# Patient Record
Sex: Female | Born: 1935 | Race: White | Hispanic: No | Smoking: Never smoker
Health system: Southern US, Community
[De-identification: ages and names within clinical notes are randomized; demographics above are authoritative.]

## PROBLEM LIST (undated history)

## (undated) DIAGNOSIS — J189 Pneumonia, unspecified organism: Secondary | ICD-10-CM

## (undated) DIAGNOSIS — W19XXXA Unspecified fall, initial encounter: Secondary | ICD-10-CM

## (undated) DIAGNOSIS — F32A Depression, unspecified: Secondary | ICD-10-CM

## (undated) DIAGNOSIS — F329 Major depressive disorder, single episode, unspecified: Secondary | ICD-10-CM

## (undated) DIAGNOSIS — E785 Hyperlipidemia, unspecified: Secondary | ICD-10-CM

## (undated) DIAGNOSIS — R296 Repeated falls: Secondary | ICD-10-CM

## (undated) DIAGNOSIS — N390 Urinary tract infection, site not specified: Secondary | ICD-10-CM

## (undated) DIAGNOSIS — M1990 Unspecified osteoarthritis, unspecified site: Secondary | ICD-10-CM

## (undated) DIAGNOSIS — E079 Disorder of thyroid, unspecified: Secondary | ICD-10-CM

## (undated) HISTORY — DX: Depression, unspecified: F32.A

## (undated) HISTORY — DX: Major depressive disorder, single episode, unspecified: F32.9

## (undated) HISTORY — DX: Urinary tract infection, site not specified: N39.0

## (undated) HISTORY — PX: APPENDECTOMY: SHX54

---

## 1997-09-05 ENCOUNTER — Other Ambulatory Visit: Admission: RE | Admit: 1997-09-05 | Discharge: 1997-09-05 | Payer: Self-pay | Admitting: Internal Medicine

## 1997-12-21 ENCOUNTER — Ambulatory Visit (HOSPITAL_COMMUNITY): Admission: RE | Admit: 1997-12-21 | Discharge: 1997-12-21 | Payer: Self-pay | Admitting: Internal Medicine

## 1999-07-09 ENCOUNTER — Other Ambulatory Visit: Admission: RE | Admit: 1999-07-09 | Discharge: 1999-07-09 | Payer: Self-pay | Admitting: Obstetrics and Gynecology

## 1999-11-28 ENCOUNTER — Ambulatory Visit (HOSPITAL_COMMUNITY): Admission: RE | Admit: 1999-11-28 | Discharge: 1999-11-28 | Payer: Self-pay | Admitting: Gastroenterology

## 2004-04-16 ENCOUNTER — Emergency Department (HOSPITAL_COMMUNITY): Admission: EM | Admit: 2004-04-16 | Discharge: 2004-04-16 | Payer: Self-pay | Admitting: *Deleted

## 2005-07-17 ENCOUNTER — Ambulatory Visit (HOSPITAL_COMMUNITY): Admission: RE | Admit: 2005-07-17 | Discharge: 2005-07-17 | Payer: Self-pay | Admitting: Gastroenterology

## 2005-12-17 ENCOUNTER — Emergency Department (HOSPITAL_COMMUNITY): Admission: EM | Admit: 2005-12-17 | Discharge: 2005-12-17 | Payer: Self-pay | Admitting: Family Medicine

## 2010-03-18 ENCOUNTER — Encounter: Payer: Self-pay | Admitting: Family Medicine

## 2012-04-09 ENCOUNTER — Observation Stay (HOSPITAL_COMMUNITY): Payer: Medicare Other

## 2012-04-09 ENCOUNTER — Observation Stay (HOSPITAL_COMMUNITY)
Admission: EM | Admit: 2012-04-09 | Discharge: 2012-04-13 | DRG: 554 | Disposition: A | Discharge status: Skilled Nursing Facility | Payer: Medicare Other | Attending: Internal Medicine | Admitting: Internal Medicine

## 2012-04-09 ENCOUNTER — Encounter (HOSPITAL_COMMUNITY): Payer: Self-pay | Admitting: Emergency Medicine

## 2012-04-09 ENCOUNTER — Emergency Department (HOSPITAL_COMMUNITY): Payer: Medicare Other

## 2012-04-09 DIAGNOSIS — M25539 Pain in unspecified wrist: Secondary | ICD-10-CM | POA: Insufficient documentation

## 2012-04-09 DIAGNOSIS — L02419 Cutaneous abscess of limb, unspecified: Secondary | ICD-10-CM | POA: Insufficient documentation

## 2012-04-09 DIAGNOSIS — N390 Urinary tract infection, site not specified: Secondary | ICD-10-CM | POA: Insufficient documentation

## 2012-04-09 DIAGNOSIS — R262 Difficulty in walking, not elsewhere classified: Secondary | ICD-10-CM

## 2012-04-09 DIAGNOSIS — T148XXA Other injury of unspecified body region, initial encounter: Secondary | ICD-10-CM | POA: Diagnosis present

## 2012-04-09 DIAGNOSIS — W19XXXA Unspecified fall, initial encounter: Secondary | ICD-10-CM | POA: Insufficient documentation

## 2012-04-09 DIAGNOSIS — IMO0002 Reserved for concepts with insufficient information to code with codable children: Secondary | ICD-10-CM | POA: Insufficient documentation

## 2012-04-09 DIAGNOSIS — L03115 Cellulitis of right lower limb: Secondary | ICD-10-CM

## 2012-04-09 DIAGNOSIS — Z9181 History of falling: Secondary | ICD-10-CM

## 2012-04-09 DIAGNOSIS — R296 Repeated falls: Secondary | ICD-10-CM

## 2012-04-09 DIAGNOSIS — M25569 Pain in unspecified knee: Secondary | ICD-10-CM | POA: Insufficient documentation

## 2012-04-09 DIAGNOSIS — L03119 Cellulitis of unspecified part of limb: Secondary | ICD-10-CM | POA: Insufficient documentation

## 2012-04-09 DIAGNOSIS — M1710 Unilateral primary osteoarthritis, unspecified knee: Principal | ICD-10-CM | POA: Insufficient documentation

## 2012-04-09 HISTORY — DX: Unspecified osteoarthritis, unspecified site: M19.90

## 2012-04-09 HISTORY — DX: Pneumonia, unspecified organism: J18.9

## 2012-04-09 LAB — CBC
Hemoglobin: 14.8 g/dL (ref 12.0–15.0)
RBC: 4.79 MIL/uL (ref 3.87–5.11)

## 2012-04-09 LAB — BASIC METABOLIC PANEL
CO2: 26 mEq/L (ref 19–32)
Glucose, Bld: 94 mg/dL (ref 70–99)
Potassium: 3.9 mEq/L (ref 3.5–5.1)
Sodium: 140 mEq/L (ref 135–145)

## 2012-04-09 MED ORDER — KETOROLAC TROMETHAMINE 15 MG/ML IJ SOLN
15.0000 mg | Freq: Once | INTRAMUSCULAR | Status: AC
  Administered 2012-04-09: 15 mg via INTRAVENOUS
  Filled 2012-04-09: qty 1

## 2012-04-09 MED ORDER — CLINDAMYCIN PHOSPHATE 600 MG/50ML IV SOLN
600.0000 mg | Freq: Once | INTRAVENOUS | Status: AC
  Administered 2012-04-09: 600 mg via INTRAVENOUS
  Filled 2012-04-09: qty 50

## 2012-04-09 MED ORDER — ACETAMINOPHEN 325 MG PO TABS
650.0000 mg | ORAL_TABLET | Freq: Once | ORAL | Status: AC
  Administered 2012-04-09: 650 mg via ORAL
  Filled 2012-04-09: qty 2

## 2012-04-09 MED ORDER — CEPHALEXIN 250 MG PO CAPS: 250.0000 mg | ORAL_CAPSULE | Freq: Four times a day (QID) | ORAL | Status: DC

## 2012-04-09 NOTE — H&P (Signed)
Chief Complaint:  freq falls  HPI: 79 yo very healthy female who lives alone who over the last several weeks has had numerous falls because "her knees are bad".  She normally walks with a walker and says she tries to get up too fast and her knees are not able to hold her.  She denies any head injuries but she has several abrasiions to her arms and legs.  No fevers.  No dysuria.  No cp, no sob.  No n/v/d.  No illnesses.  She says "if i could just get my knees fixed and my teeth fixed i could live by myself forever."  She has been having issue with her knees for several years and has not had this evaluated.  No slurred speech.  No weakness in her upper ext but some in her legs.  She has been thinking about moving into an assisted living place.  Review of Systems:  Positive and negative as per HPI otherwise all other systems are negative  Past Medical History: negative   Medications: Prior to Admission medications   Medication Sig Start Date End Date Taking? Authorizing Provider  OVER THE COUNTER MEDICATION Take 1 tablet by mouth daily.   Yes Historical Provider, MD  cephALEXin (KEFLEX) 250 MG capsule Take 1 capsule (250 mg total) by mouth 4 (four) times daily. 04/09/12   Toy Cookey, MD    Allergies:  No Known Allergies  Social History:  reports that she has never smoked. She does not have any smokeless tobacco history on file. She reports that she does not drink alcohol or use illicit drugs.  Family History: Negative   Physical Exam: Filed Vitals:   04/09/12 1729 04/09/12 2320  BP: 129/82 141/70  Pulse: 83 79  Temp: 98.4 F (36.9 C)   TempSrc: Oral   Resp:  18  SpO2: 99% 100%   General appearance: alert, cooperative and no distress Neck: no JVD and supple, symmetrical, trachea midline Lungs: clear to auscultation bilaterally Heart: regular rate and rhythm, S1, S2 normal, no murmur, click, rub or gallop Abdomen: soft, non-tender; bowel sounds normal; no masses,  no  organomegaly Pelvic: pelvis stable and nonpainful with movement Extremities: extremities normal, atraumatic, no cyanosis or edema Pulses: 2+ and symmetric Skin: several abrasions to knees ankles none looked infected Neurologic: Grossly normal    Labs on Admission:   Recent Labs  04/09/12 2048  NA 140  K 3.9  CL 104  CO2 26  GLUCOSE 94  BUN 17  CREATININE 0.50  CALCIUM 9.2    Recent Labs  04/09/12 2048  WBC 7.0  HGB 14.8  HCT 42.9  MCV 89.6  PLT 263    Radiological Exams on Admission: Dg Elbow 2 Views Right  04/09/2012  *RADIOLOGY REPORT*  Clinical Data: Fall.  Elbow pain.  RIGHT ELBOW - 2 VIEW  Comparison: None.  Findings: Calcification noted along the elbow joint, potentially from chondrocalcinosis.  Indistinct anterior fat pad favors elbow joint effusion.  Prominent spurring along articular surfaces.  A discrete fracture is not identified on this two views series.  IMPRESSION: 1.  Elbow joint effusion, with chondrocalcinosis.  Cannot exclude loose bodies in the joint.  2.  Prominent spurring.  3.  No fracture observed.   Original Report Authenticated By: Gaylyn Rong, M.D.    Dg Knee 2 Views Left  04/09/2012  *RADIOLOGY REPORT*  Clinical Data: Fall.  Knee pain.  LEFT KNEE - 1-2 VIEW  Comparison: None.  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  Prepatellar infiltrative edema noted.  Exam indeterminate for knee effusion due to the degree of flexion and spurring.  A 1.4 cm subcortical cystic lesion is present in the lateral tibial plateau, with a similar lesion along the tibial spine.  No overt fracture observed.  IMPRESSION:  1.  Severe tri-compartmental spurring loss of articular space, with scattered calcifications along the joint suspicious for synovial osteochondromatosis. 2.  No definite fracture. 3.  Prepatellar subcutaneous edema. 4.  Subcortical cystic lesions along the tibial  plateau. 5.  Degree of cortical irregularity reduces sensitivity for nondisplaced fractures.   Original Report Authenticated By: Gaylyn Rong, M.D.    Dg Knee 2 Views Right  04/09/2012  *RADIOLOGY REPORT*  Clinical Data: Fall.  Knee pain.  RIGHT KNEE - 1-2 VIEW  Comparison: None.  Findings: As on the contralateral side, there is severe spurring and loss articular space with calcifications along the joint. Possibilities include CPPD arthropathy or synovial osteochondromatosis.  Mild prepatellar subcutaneous edema noted. No discrete fracture.  IMPRESSION:  1.  No discrete fracture. 2.  Scattered synovial calcifications and possible chondrocalcinosis, query CPPD arthropathy and / or synovial osteochondromatosis. 3.  Severe spurring and loss articular space.   Original Report Authenticated By: Gaylyn Rong, M.D.     Assessment/Plan 79 yo female with freq falls  Principal Problem:   Unable to ambulate Active Problems:   Frequent falls   Abrasion  Will obs the pt overnight.  Obtain physical therapy evaluation in am.  Ck cth to make sure no intracranial abnormality to explain frequent falls, no focal deficits but pt cannot ambulate.  She can bear her own weight but her knees are too weak too walk with her cane.  Abrasions do not appear to have surrounding cellulitis will hold off on further abx at this time, needs to be monitored while here though.  Med bed.  Geraline Halberstadt A 04/09/2012, 11:49 PM

## 2012-04-09 NOTE — ED Provider Notes (Signed)
History     CSN: [id number]  Arrival date & time 04/09/12  1722   First MD Initiated Contact with Patient 04/09/12 1743      Chief Complaint  Patient presents with  . Fall  . Knee Pain  . Elbow Pain    (Consider location/radiation/quality/duration/timing/severity/associated sxs/prior treatment) Patient is a 79 y.o. female presenting with fall and knee pain. The history is provided by the patient. No language interpreter was used.  Fall The accident occurred less than 1 hour ago. The fall occurred while walking. She fell from a height of 3 to 5 ft. She landed on a hard floor. There was no blood loss. The point of impact was the right elbow, right knee and left knee. The pain is present in the right elbow, left knee and right knee. The pain is moderate. She was not ambulatory at the scene. There was no entrapment after the fall. There was no drug use involved in the accident. There was no alcohol use involved in the accident. Pertinent negatives include no fever, no numbness, no abdominal pain, no nausea, no vomiting, no headaches, no loss of consciousness and no tingling. The symptoms are aggravated by activity and pressure on the injury. She has tried nothing for the symptoms. The treatment provided no relief.  Knee Pain Associated symptoms: no back pain, no fatigue, no fever and no neck pain     History reviewed. No pertinent past medical history.  History reviewed. No pertinent past surgical history.  No family history on file.  History  Substance Use Topics  . Smoking status: Never Smoker   . Smokeless tobacco: Not on file  . Alcohol Use: No    OB History   Grav Para Term Preterm Abortions TAB SAB Ect Mult Living                  Review of Systems  Constitutional: Negative for fever, chills, diaphoresis, activity change, appetite change and fatigue.  HENT: Negative for congestion, sore throat, facial swelling, rhinorrhea, neck pain and neck stiffness.   Eyes: Negative  for photophobia and discharge.  Respiratory: Negative for cough, chest tightness and shortness of breath.   Cardiovascular: Negative for chest pain, palpitations and leg swelling.  Gastrointestinal: Negative for nausea, vomiting, abdominal pain and diarrhea.  Endocrine: Negative for polydipsia and polyuria.  Genitourinary: Negative for dysuria, frequency, difficulty urinating and pelvic pain.  Musculoskeletal: Positive for arthralgias. Negative for back pain.  Skin: Negative for color change and wound.  Allergic/Immunologic: Negative for immunocompromised state.  Neurological: Negative for tingling, loss of consciousness, facial asymmetry, weakness, numbness and headaches.  Hematological: Does not bruise/bleed easily.  Psychiatric/Behavioral: Negative for confusion and agitation.    Allergies  Review of patient's allergies indicates no known allergies.  Home Medications   Current Outpatient Rx  Name  Route  Sig  Dispense  Refill  . OVER THE COUNTER MEDICATION   Oral   Take 1 tablet by mouth daily.         . cephALEXin (KEFLEX) 250 MG capsule   Oral   Take 1 capsule (250 mg total) by mouth 4 (four) times daily.   28 capsule   0     BP 141/70  Pulse 79  Temp(Src) 98.4 F (36.9 C) (Oral)  Resp 18  SpO2 100%  Physical Exam  Constitutional: She is oriented to person, place, and time. She appears well-developed and well-nourished. No distress.  HENT:  Head: Normocephalic and atraumatic.  Mouth/Throat: No  oropharyngeal exudate.  Eyes: Pupils are equal, round, and reactive to light.  Neck: Normal range of motion. Neck supple.  Cardiovascular: Normal rate, regular rhythm and normal heart sounds.  Exam reveals no gallop and no friction rub.   No murmur heard. Pulmonary/Chest: Effort normal and breath sounds normal. No respiratory distress. She has no wheezes. She has no rales.  Abdominal: Soft. Bowel sounds are normal. She exhibits no distension and no mass. There is no  tenderness. There is no rebound and no guarding.  Musculoskeletal: Normal range of motion. She exhibits no edema and no tenderness.       Right knee: She exhibits erythema. Tenderness found.       Right forearm: She exhibits tenderness and bony tenderness.       Arms:      Legs: Neurological: She is alert and oriented to person, place, and time.  Skin: Skin is warm and dry.  Psychiatric: She has a normal mood and affect.    ED Course  Procedures (including critical care time)  Labs Reviewed  BASIC METABOLIC PANEL - Abnormal; Notable for the following:    GFR calc non Af Amer 82 (*)    All other components within normal limits  CBC   Dg Elbow 2 Views Right  04/09/2012  *RADIOLOGY REPORT*  Clinical Data: Fall.  Elbow pain.  RIGHT ELBOW - 2 VIEW  Comparison: None.  Findings: Calcification noted along the elbow joint, potentially from chondrocalcinosis.  Indistinct anterior fat pad favors elbow joint effusion.  Prominent spurring along articular surfaces.  A discrete fracture is not identified on this two views series.  IMPRESSION: 1.  Elbow joint effusion, with chondrocalcinosis.  Cannot exclude loose bodies in the joint.  2.  Prominent spurring.  3.  No fracture observed.   Original Report Authenticated By: Gaylyn Rong, M.D.    Dg Knee 2 Views Left  04/09/2012  *RADIOLOGY REPORT*  Clinical Data: Fall.  Knee pain.  LEFT KNEE - 1-2 VIEW  Comparison: None.  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  Prepatellar infiltrative edema noted.  Exam indeterminate for knee effusion due to the degree of flexion and spurring.  A 1.4 cm subcortical cystic lesion is present in the lateral tibial plateau, with a similar lesion along the tibial spine.  No overt fracture observed.  IMPRESSION:  1.  Severe tri-compartmental spurring loss of articular space, with scattered calcifications along the joint suspicious for  synovial osteochondromatosis. 2.  No definite fracture. 3.  Prepatellar subcutaneous edema. 4.  Subcortical cystic lesions along the tibial plateau. 5.  Degree of cortical irregularity reduces sensitivity for nondisplaced fractures.   Original Report Authenticated By: Gaylyn Rong, M.D.    Dg Knee 2 Views Right  04/09/2012  *RADIOLOGY REPORT*  Clinical Data: Fall.  Knee pain.  RIGHT KNEE - 1-2 VIEW  Comparison: None.  Findings: As on the contralateral side, there is severe spurring and loss articular space with calcifications along the joint. Possibilities include CPPD arthropathy or synovial osteochondromatosis.  Mild prepatellar subcutaneous edema noted. No discrete fracture.  IMPRESSION:  1.  No discrete fracture. 2.  Scattered synovial calcifications and possible chondrocalcinosis, query CPPD arthropathy and / or synovial osteochondromatosis. 3.  Severe spurring and loss articular space.   Original Report Authenticated By: Gaylyn Rong, M.D.      1. Fall from standing   2. Cellulitis of leg without foot, right   3. Abrasion   4. Frequent  falls   5. Unable to ambulate       MDM  12:35 AM Pt is a 79 y.o. female with no pertinent PMHX who presents with mechanical fall while walking in her house today after she took a "turn too fast".  She complains of R elbow R knee, mild L knee pain.  She denies h/a, neck pain, chest pain, ab pain, pelvic pain.  She also reports a similar fall about 1 week ago, during which she also fell on R elbow & R knee and has abrasions at both.  GCS 15, no focal neuro findings on exam.  +ttp at R olecranon process of elbow, +TTP BL knees.  Abrasion of R knee has mild surrounding erythema w/ concern for development of cellulitis.  Have ordered XR elbow, BL knee.  WIll plan on treating for cellulitis w/ PO keflex. Pt does not want anything for pain.    12:35 AM Attempted to ambulate pt, who was unable.  No family available to pick up or meet ambulance at her  house.  Given her immobolity, cellulitis, I feel admission necessary.   12:35 AM Have spoken to IM who request CT head, if nml will admit to a medical bed.  CT head unremarkable per radiology quick review upon request.  Will admit to IM.Marland Kitchen  1. Fall from standing   2. Cellulitis of leg without foot, right   3. Abrasion   4. Frequent falls   5. Unable to ambulate      Labs and imaging considered in decision making, reviewed by myself.  Imaging interpreted by radiology. Pt care discussed with my attending, Dr. Silverio Lay.         Toy Cookey, MD 04/10/12 [phone]

## 2012-04-09 NOTE — ED Notes (Signed)
EMS-pt was walking in snow and fell, landing on right side. Pt with hx of falls and uses cane for ambulation. Pt reports pain to right elbow and right knee. On exam pt with hematoma and scabbing noted to right elbow, right knee, and right ankle. Pt moves all extremities. Mild redness noted.

## 2012-04-09 NOTE — ED Notes (Signed)
Attempted to ambulate pt.  Pt states has "special walker" at home that she uses and only has her cane here with her.  Pt will not attempt to try to walk with this nurse.

## 2012-04-09 NOTE — ED Provider Notes (Addendum)
I have supervised the resident on the management of this patient and agree with the note above. I personally interviewed and examined the patient and my addendum is below.   Holly Rivera is a 79 y.o. female here s/p fall. Mechanical fall. Hit R elbow and R knee. No head injury. She fell a week ago and had some abrasions now becoming cellulitis. Tetanus up to date Patient ambulating, comfortable. R knee and elbow ROM nl. Will get xrays, will treat for cellulitis with keflex.   7:19 PM Xray showed arthritis, no fractures. Patient unable to ambulate and no one to care for her at home. Will need admission for cellulitis and rehab.    Richardean Canal, MD 04/09/12 1919  Richardean Canal, MD 04/09/12 2049

## 2012-04-10 ENCOUNTER — Encounter (HOSPITAL_COMMUNITY): Payer: Self-pay | Admitting: Radiology

## 2012-04-10 DIAGNOSIS — M1710 Unilateral primary osteoarthritis, unspecified knee: Secondary | ICD-10-CM

## 2012-04-10 LAB — URINALYSIS, ROUTINE W REFLEX MICROSCOPIC
Glucose, UA: NEGATIVE mg/dL
Protein, ur: NEGATIVE mg/dL
Specific Gravity, Urine: 1.028 (ref 1.005–1.030)
pH: 5 (ref 5.0–8.0)

## 2012-04-10 LAB — URINE MICROSCOPIC-ADD ON

## 2012-04-10 MED ORDER — SODIUM CHLORIDE 0.9 % IV SOLN: 250.0000 mL | INTRAVENOUS | Status: DC | PRN

## 2012-04-10 MED ORDER — ENOXAPARIN SODIUM 30 MG/0.3ML ~~LOC~~ SOLN
30.0000 mg | SUBCUTANEOUS | Status: DC
  Administered 2012-04-10 – 2012-04-13 (×4): 30 mg via SUBCUTANEOUS
  Filled 2012-04-10 (×4): qty 0.3

## 2012-04-10 MED ORDER — ACETAMINOPHEN 325 MG PO TABS
650.0000 mg | ORAL_TABLET | Freq: Four times a day (QID) | ORAL | Status: DC | PRN
  Administered 2012-04-10 – 2012-04-12 (×3): 650 mg via ORAL
  Filled 2012-04-10 (×3): qty 2

## 2012-04-10 MED ORDER — TRAMADOL HCL 50 MG PO TABS
50.0000 mg | ORAL_TABLET | Freq: Two times a day (BID) | ORAL | Status: DC | PRN
  Administered 2012-04-12: 50 mg via ORAL
  Filled 2012-04-10: qty 1

## 2012-04-10 MED ORDER — MUSCLE RUB 10-15 % EX CREA
TOPICAL_CREAM | CUTANEOUS | Status: DC | PRN
  Administered 2012-04-10 – 2012-04-12 (×3): via TOPICAL
  Filled 2012-04-10: qty 85

## 2012-04-10 MED ORDER — SODIUM CHLORIDE 0.9 % IJ SOLN: 3.0000 mL | INTRAMUSCULAR | Status: DC | PRN

## 2012-04-10 MED ORDER — SODIUM CHLORIDE 0.9 % IJ SOLN
3.0000 mL | Freq: Two times a day (BID) | INTRAMUSCULAR | Status: DC
Start: 2012-04-10 — End: 2012-04-13
  Administered 2012-04-11: 3 mL via INTRAVENOUS

## 2012-04-10 NOTE — ED Notes (Signed)
Report given to angelo, rn,  Pt transported via stretcher to floor.

## 2012-04-10 NOTE — Evaluation (Signed)
Physical Therapy Evaluation Patient Details Name: Holly Rivera MRN: [id number] DOB: [birth year] Today's Date: 04/10/2012 Time: 4098-1191 PT Time Calculation (min): 18 min  PT Assessment / Plan / Recommendation Clinical Impression  Pt presents secondary to multiple falls and inability to ambulate.  Pt cooperative and willing to participate but at this time still is not able to reach standing position secondary to pain.  Pt unwilling to attempt ambulation secondary to pain in right knee.  Pt required max total assist for a stand pivot to chair.  Strength testing in LE was also limited by pain.  At this time do not feel patient will be safe to return to living alone.  Unsure root cause of patients inability to stand or ambulate. Will continue to see acutely to address deficits, PT recommending SNF as pt states no one can stay with her or take care of her. (no family present, question pts ability as historian)    PT Assessment  Patient needs continued PT services    Follow Up Recommendations  SNF    Does the patient have the potential to tolerate intense rehabilitation      Barriers to Discharge Inaccessible home environment;Decreased caregiver support Pt has stairs to enter home, patient lives alone    Equipment Recommendations  None recommended by PT    Recommendations for Other Services OT consult   Frequency Min 3X/week    Precautions / Restrictions Restrictions Weight Bearing Restrictions: No   Pertinent Vitals/Pain "alot of pain"       Mobility  Bed Mobility Bed Mobility: Rolling Right;Right Sidelying to Sit;Sitting - Scoot to Edge of Bed Rolling Right: 5: Supervision Right Sidelying to Sit: 5: Supervision Sitting - Scoot to Edge of Bed: 5: Supervision Transfers Transfers: Sit to Stand;Stand to [employer] Transfers Sit to Stand: 2: Max assist;From bed;Other (comment) (unable to perform despite 3 attempts) Stand Pivot Transfers: 1: +1 Total assist Details for  Transfer Assistance:  (Pt unable to bear weight and reach standing position) Ambulation/Gait Ambulation/Gait Assistance: Not tested (comment) (pts unable to reach standing, amb. not possible at this time)    Exercises     PT Diagnosis:    PT Problem List: Decreased strength;Decreased range of motion;Decreased activity tolerance;Decreased balance;Decreased mobility;Decreased coordination PT Treatment Interventions: DME instruction;Gait training;Stair training;Functional mobility training;Therapeutic activities;Therapeutic exercise;Balance training;Patient/family education   PT Goals Acute Rehab PT Goals PT Goal Formulation: With patient Time For Goal Achievement: 04/17/12 Potential to Achieve Goals: Fair Pt will go Supine/Side to Sit: Independently PT Goal: Supine/Side to Sit - Progress: Goal set today Pt will go Sit to Supine/Side: with modified independence PT Goal: Sit to Supine/Side - Progress: Goal set today Pt will go Sit to Stand: with supervision PT Goal: Sit to Stand - Progress: Goal set today Pt will go Stand to Sit: with supervision PT Goal: Stand to Sit - Progress: Goal set today Pt will Stand: with modified independence PT Goal: Stand - Progress: Goal set today Pt will Ambulate: 51 - 150 feet PT Goal: Ambulate - Progress: Goal set today Pt will Go Up / Down Stairs: 6-9 stairs;with min assist PT Goal: Up/Down Stairs - Progress: Goal set today  Visit Information  Last PT Received On: 04/10/12 Assistance Needed: +2    Subjective Data  Subjective: I would like to try to get up Patient Stated Goal: to be able to walk   Prior Functioning  Home Living Lives With: Alone Type of Home: Apartment Home Access: Stairs to enter [employer] of  Steps: 6 Home Layout: One level Home Adaptive Equipment: Straight cane;Walker - rolling Prior Function Level of Independence: Independent with assistive device(s) Able to Take Stairs?: Yes (with difficulty) Driving: No     Cognition  Cognition Overall Cognitive Status: No family/caregiver present to determine baseline cognitive functioning Arousal/Alertness: Awake/alert Orientation Level: Oriented X4 / Intact Behavior During Session: [facility] for tasks performed    Extremity/Trunk Assessment Right Upper Extremity Assessment RUE ROM/Strength/Tone: [facility] for tasks assessed Left Upper Extremity Assessment LUE ROM/Strength/Tone: [facility] for tasks assessed Right Lower Extremity Assessment RLE ROM/Strength/Tone: Deficits;Unable to fully assess;Due to pain Left Lower Extremity Assessment LLE ROM/Strength/Tone: [facility] for tasks assessed   Balance Balance Balance Assessed: Yes Static Sitting Balance Static Sitting - Balance Support: Feet supported Static Sitting - Level of Assistance: 5: Stand by assistance Static Sitting - Comment/# of Minutes: 3 minutes  End of Session PT - End of Session Equipment Utilized During Treatment: Gait belt Activity Tolerance: Patient limited by pain Patient left: in chair;with call bell/phone within reach Nurse Communication: Mobility status  GP Functional Assessment Tool Used: clinical judgement Functional Limitation: Mobility: Walking and moving around Mobility: Walking and Moving Around Current Status (E9528): At least 20 percent but less than 40 percent impaired, limited or restricted Mobility: Walking and Moving Around Goal Status [id number]): At least 60 percent but less than 80 percent impaired, limited or restricted   Fabio Asa 04/10/2012, 2:01 PM Charlotte Crumb, PT DPT  [phone]

## 2012-04-10 NOTE — Progress Notes (Addendum)
[facility] Note  Subjective: Interval History: none.  Holly Rivera reports that she has had knee pain for many years and has wanted to get her knees operated on, but has not been able to afford to.  She also has poor dentition, which limits her surgery options.  She walks in her home with a cane and frequently falls due to her knees giving out.  She is interested in moving in to a place with more assistance.   Objective: Vital signs in last 24 hours: Temp:  [98 F (36.7 C)-98.4 F (36.9 C)] 98 F (36.7 C) (02/14 0658) Pulse Rate:  [79-83] 83 (02/14 0658) Resp:  [18-20] 18 (02/14 0658) BP: (114-141)/(66-82) 114/72 mmHg (02/14 0658) SpO2:  [98 %-100 %] 98 % (02/14 0658) Weight:  [138 lb 3.7 oz (62.7 kg)] 138 lb 3.7 oz (62.7 kg) (02/14 0115)  PE:  Gen: Awake, alert, pleasant HENT; NCAT, EOMI Lungs: CTAB, no wheezing.  CVS: RR, NR, no murmur Abd: Soft, NT, ND, +BS Ext: large deformities to the right knee, likely due to longstanding OA, Limbs are thin, no acute effusion, bruising or erythema.  She also has changes to the left knee, though not as severe Pulses: 2+ and symmetric Neuro: Grossly normal, oriented to person, place and time.   Results for orders placed during the hospital encounter of 04/09/12 (from the past 24 hour(s))  CBC     Status: None   Collection Time    04/09/12  8:48 PM      Result Value Range   WBC 7.0  4.0 - 10.5 K/uL   RBC 4.79  3.87 - 5.11 MIL/uL   Hemoglobin 14.8  12.0 - 15.0 g/dL   HCT 16.1  09.6 - 04.5 %   MCV 89.6  78.0 - 100.0 fL   MCH 30.9  26.0 - 34.0 pg   MCHC 34.5  30.0 - 36.0 g/dL   RDW 40.9  81.1 - 91.4 %   Platelets 263  150 - 400 K/uL  BASIC METABOLIC PANEL     Status: Abnormal   Collection Time    04/09/12  8:48 PM      Result Value Range   Sodium 140  135 - 145 mEq/L   Potassium 3.9  3.5 - 5.1 mEq/L   Chloride 104  96 - 112 mEq/L   CO2 26  19 - 32 mEq/L   Glucose, Bld 94  70 - 99 mg/dL   BUN 17  6 - 23 mg/dL   Creatinine,  Ser 7.82  0.50 - 1.10 mg/dL   Calcium 9.2  8.4 - 95.6 mg/dL   GFR calc non Af Amer 82 (*) >90 mL/min   GFR calc Af Amer >90  >90 mL/min    Studies/Results: Dg Elbow 2 Views Right  04/09/2012  *RADIOLOGY REPORT*  Clinical Data: Fall.  Elbow pain.  RIGHT ELBOW - 2 VIEW  Comparison: None.  Findings: Calcification noted along the elbow joint, potentially from chondrocalcinosis.  Indistinct anterior fat pad favors elbow joint effusion.  Prominent spurring along articular surfaces.  A discrete fracture is not identified on this two views series.  IMPRESSION: 1.  Elbow joint effusion, with chondrocalcinosis.  Cannot exclude loose bodies in the joint.  2.  Prominent spurring.  3.  No fracture observed.   Original Report Authenticated By: Gaylyn Rong, M.D.    Dg Knee 2 Views Left  04/09/2012  *RADIOLOGY REPORT*  Clinical Data: Fall.  Knee pain.  LEFT KNEE - 1-2  VIEW  Comparison: None.  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  Prepatellar infiltrative edema noted.  Exam indeterminate for knee effusion due to the degree of flexion and spurring.  A 1.4 cm subcortical cystic lesion is present in the lateral tibial plateau, with a similar lesion along the tibial spine.  No overt fracture observed.  IMPRESSION:  1.  Severe tri-compartmental spurring loss of articular space, with scattered calcifications along the joint suspicious for synovial osteochondromatosis. 2.  No definite fracture. 3.  Prepatellar subcutaneous edema. 4.  Subcortical cystic lesions along the tibial plateau. 5.  Degree of cortical irregularity reduces sensitivity for nondisplaced fractures.   Original Report Authenticated By: Gaylyn Rong, M.D.    Dg Knee 2 Views Right  04/09/2012  *RADIOLOGY REPORT*  Clinical Data: Fall.  Knee pain.  RIGHT KNEE - 1-2 VIEW  Comparison: None.  Findings: As on the contralateral side, there is severe spurring and loss  articular space with calcifications along the joint. Possibilities include CPPD arthropathy or synovial osteochondromatosis.  Mild prepatellar subcutaneous edema noted. No discrete fracture.  IMPRESSION:  1.  No discrete fracture. 2.  Scattered synovial calcifications and possible chondrocalcinosis, query CPPD arthropathy and / or synovial osteochondromatosis. 3.  Severe spurring and loss articular space.   Original Report Authenticated By: Gaylyn Rong, M.D.    Ct Head Wo Contrast  04/10/2012  *RADIOLOGY REPORT*  Clinical Data: Status post fall; landed on right side.  Concern for head injury.  CT HEAD WITHOUT CONTRAST  Technique:  Contiguous axial images were obtained from the base of the skull through the vertex without contrast.  Comparison: None.  Findings: There is no evidence of acute infarction, mass lesion, or intra- or extra-axial hemorrhage on CT.  Prominence of the ventricles and sulci reflects mild cortical volume loss.  Scattered periventricular and subcortical white matter change likely reflects small vessel ischemic microangiopathy.  Mild cerebellar atrophy is noted.  The brainstem and fourth ventricle are within normal limits.  The basal ganglia are unremarkable in appearance.  The cerebral hemispheres demonstrate grossly normal gray-white differentiation. No mass effect or midline shift is seen.  There is no evidence of fracture; there is chronic degeneration at the right temporomandibular joint.  The visualized portions of the orbits are within normal limits.  The paranasal sinuses and mastoid air cells are well-aerated.  No significant soft tissue abnormalities are seen.  IMPRESSION:  1.  No evidence of traumatic intracranial injury or fracture. 2.  Mild cortical volume loss and scattered small vessel ischemic microangiopathy. 3.  Chronic degeneration at the right temporomandibular joint.   Original Report Authenticated By: Tonia Ghent, M.D.     Scheduled Meds: . enoxaparin (LOVENOX)  injection  30 mg Subcutaneous Q24H  . sodium chloride  3 mL Intravenous Q12H   Continuous Infusions:  PRN Meds:sodium chloride, sodium chloride  Assessment/Plan:  1. Unable to ambulate, severe OA to knees - Awaiting PT assessment today  - She will likely need SNF for rehab vs. ALF  - She will need community/PCP assistance to get her teeth evaluated (she may not be able to afford this) and possibly have knee surgery, however, given her age, this may not be possible - Abrasions due to fall are covered - No AM labs needed, today's were normal  Disposition: Pending PT evaluation   LOS: 1 day   Shantika Bermea

## 2012-04-11 DIAGNOSIS — R5381 Other malaise: Secondary | ICD-10-CM

## 2012-04-11 MED ORDER — CIPROFLOXACIN HCL 250 MG PO TABS
250.0000 mg | ORAL_TABLET | Freq: Two times a day (BID) | ORAL | Status: DC
  Administered 2012-04-11 – 2012-04-13 (×5): 250 mg via ORAL
  Filled 2012-04-11 (×8): qty 1

## 2012-04-11 NOTE — Plan of Care (Signed)
Problem: Phase II Progression Outcomes Goal: IV changed to normal saline lock Outcome: Not Applicable Date Met:  04/11/12 Patient removed SL/IV.

## 2012-04-11 NOTE — Progress Notes (Signed)
Was called to the patient's room after receiving a phone call from the secretary that the patient was on the phone with [facility] [employer].  She had told them that she had fallen and got herself back in the bed.  Upon arrival to the room, the patient was upside down in the bed.  She wiggles her body in the bed but she cannot lift her shoulders off the bed under her own power, nor can she get herself up to the bedside commode without significant assistance.  The patient has been confused all day as she has a UTI.  Patient was examined thoroughly and found to have no new injury (the patient is here for frequent falls at home).  Therefore it is felt that the patient had an episode of confusion and did not fall, especially since she cannot get herself up or position herself to any degree.  Will continue to follow.  ---------Roland Rack, RN

## 2012-04-11 NOTE — Progress Notes (Signed)
[facility] Note  Subjective: Interval History: none.  Holly Rivera reports that she has had knee pain for many years and has wanted to get her knees operated on, but has not been able to afford to.  She also has poor dentition, which limits her surgery options.  She walks in her home with a cane and frequently falls due to her knees giving out.  She is interested in moving in to a place with more assistance. Patient is awaiting placement at this time. She asked me to have some bengay to use for her knees  Objective: Vital signs in last 24 hours: Temp:  [98.2 F (36.8 C)-99.7 F (37.6 C)] 99.7 F (37.6 C) (02/15 0610) Pulse Rate:  [86-98] 86 (02/15 0610) Resp:  [18-20] 18 (02/15 0610) BP: (101-134)/(46-66) 123/59 mmHg (02/15 0610) SpO2:  [96 %-98 %] 96 % (02/15 0610)  PE:  Gen: Awake, alert, pleasant HENT; NCAT, EOMI Lungs: CTAB, no wheezing.  CVS: RR, NR, no murmur Abd: Soft, NT, ND, +BS Ext: large deformities to the right knee, likely due to longstanding OA, Limbs are thin, no acute effusion, bruising or erythema.  She also has changes to the left knee, though not as severe Pulses: 2+ and symmetric Neuro: Grossly normal, oriented to person, place and time.   Results for orders placed during the hospital encounter of 04/09/12 (from the past 24 hour(s))  URINALYSIS, ROUTINE W REFLEX MICROSCOPIC     Status: Abnormal   Collection Time    04/10/12  8:35 PM      Result Value Range   Color, Urine YELLOW  YELLOW   APPearance TURBID (*) CLEAR   Specific Gravity, Urine 1.028  1.005 - 1.030   pH 5.0  5.0 - 8.0   Glucose, UA NEGATIVE  NEGATIVE mg/dL   Hgb urine dipstick MODERATE (*) NEGATIVE   Bilirubin Urine NEGATIVE  NEGATIVE   Ketones, ur NEGATIVE  NEGATIVE mg/dL   Protein, ur NEGATIVE  NEGATIVE mg/dL   Urobilinogen, UA 0.2  0.0 - 1.0 mg/dL   Nitrite POSITIVE (*) NEGATIVE   Leukocytes, UA LARGE (*) NEGATIVE  URINE MICROSCOPIC-ADD ON     Status: Abnormal   Collection Time     04/10/12  8:35 PM      Result Value Range   Squamous Epithelial / LPF MANY (*) RARE   WBC, UA TOO NUMEROUS TO COUNT  <3 WBC/hpf   RBC / HPF 0-2  <3 RBC/hpf   Bacteria, UA MANY (*) RARE   Crystals CA OXALATE CRYSTALS (*) NEGATIVE    Studies/Results: Dg Elbow 2 Views Right  04/09/2012  *RADIOLOGY REPORT*  Clinical Data: Fall.  Elbow pain.  RIGHT ELBOW - 2 VIEW  Comparison: None.  Findings: Calcification noted along the elbow joint, potentially from chondrocalcinosis.  Indistinct anterior fat pad favors elbow joint effusion.  Prominent spurring along articular surfaces.  A discrete fracture is not identified on this two views series.  IMPRESSION: 1.  Elbow joint effusion, with chondrocalcinosis.  Cannot exclude loose bodies in the joint.  2.  Prominent spurring.  3.  No fracture observed.   Original Report Authenticated By: Gaylyn Rong, M.D.    Dg Knee 2 Views Left  04/09/2012  *RADIOLOGY REPORT*  Clinical Data: Fall.  Knee pain.  LEFT KNEE - 1-2 VIEW  Comparison: None.  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  Prepatellar infiltrative edema noted.  Exam indeterminate for knee effusion due to the  degree of flexion and spurring.  A 1.4 cm subcortical cystic lesion is present in the lateral tibial plateau, with a similar lesion along the tibial spine.  No overt fracture observed.  IMPRESSION:  1.  Severe tri-compartmental spurring loss of articular space, with scattered calcifications along the joint suspicious for synovial osteochondromatosis. 2.  No definite fracture. 3.  Prepatellar subcutaneous edema. 4.  Subcortical cystic lesions along the tibial plateau. 5.  Degree of cortical irregularity reduces sensitivity for nondisplaced fractures.   Original Report Authenticated By: Gaylyn Rong, M.D.    Dg Knee 2 Views Right  04/09/2012  *RADIOLOGY REPORT*  Clinical Data: Fall.  Knee pain.  RIGHT KNEE - 1-2  VIEW  Comparison: None.  Findings: As on the contralateral side, there is severe spurring and loss articular space with calcifications along the joint. Possibilities include CPPD arthropathy or synovial osteochondromatosis.  Mild prepatellar subcutaneous edema noted. No discrete fracture.  IMPRESSION:  1.  No discrete fracture. 2.  Scattered synovial calcifications and possible chondrocalcinosis, query CPPD arthropathy and / or synovial osteochondromatosis. 3.  Severe spurring and loss articular space.   Original Report Authenticated By: Gaylyn Rong, M.D.    Ct Head Wo Contrast  04/10/2012  *RADIOLOGY REPORT*  Clinical Data: Status post fall; landed on right side.  Concern for head injury.  CT HEAD WITHOUT CONTRAST  Technique:  Contiguous axial images were obtained from the base of the skull through the vertex without contrast.  Comparison: None.  Findings: There is no evidence of acute infarction, mass lesion, or intra- or extra-axial hemorrhage on CT.  Prominence of the ventricles and sulci reflects mild cortical volume loss.  Scattered periventricular and subcortical white matter change likely reflects small vessel ischemic microangiopathy.  Mild cerebellar atrophy is noted.  The brainstem and fourth ventricle are within normal limits.  The basal ganglia are unremarkable in appearance.  The cerebral hemispheres demonstrate grossly normal gray-white differentiation. No mass effect or midline shift is seen.  There is no evidence of fracture; there is chronic degeneration at the right temporomandibular joint.  The visualized portions of the orbits are within normal limits.  The paranasal sinuses and mastoid air cells are well-aerated.  No significant soft tissue abnormalities are seen.  IMPRESSION:  1.  No evidence of traumatic intracranial injury or fracture. 2.  Mild cortical volume loss and scattered small vessel ischemic microangiopathy. 3.  Chronic degeneration at the right temporomandibular joint.    Original Report Authenticated By: Tonia Ghent, M.D.     Scheduled Meds: . ciprofloxacin  250 mg Oral BID  . enoxaparin (LOVENOX) injection  30 mg Subcutaneous Q24H  . sodium chloride  3 mL Intravenous Q12H   Continuous Infusions:  PRN Meds:sodium chloride, acetaminophen, Muscle Rub, sodium chloride, traMADol  Assessment/Plan:  # Unable to ambulate, severe OA to knees:  Patient presented secondary to multiple falls and inability to ambulate.  - PT/OT recommends SNF.  - She will need community/PCP assistance to get her teeth evaluated (she may not be able to afford this) and possibly have knee surgery, however, given her age, this may not be possible - No need for AM labs  Disposition: Patient to be transferred to SNF on Monday.   LOS: 2 days   Lars Mage Pager [phone]  Please refer to [facility] on call on [facility]ION for night coverage. 7 PM to 7 AM

## 2012-04-12 LAB — CBC WITH DIFFERENTIAL/PLATELET
Basophils Absolute: 0 10*3/uL (ref 0.0–0.1)
Basophils Relative: 0 % (ref 0–1)
Hemoglobin: 13.2 g/dL (ref 12.0–15.0)
MCHC: 34.3 g/dL (ref 30.0–36.0)
Monocytes Relative: 10 % (ref 3–12)
Neutro Abs: 7.4 10*3/uL (ref 1.7–7.7)
Neutrophils Relative %: 78 % — ABNORMAL HIGH (ref 43–77)
RDW: 13.9 % (ref 11.5–15.5)

## 2012-04-12 LAB — BASIC METABOLIC PANEL
Chloride: 104 mEq/L (ref 96–112)
GFR calc Af Amer: >90 mL/min (ref >90)
Potassium: 3.9 mEq/L (ref 3.5–5.1)

## 2012-04-12 MED ORDER — SENNOSIDES-DOCUSATE SODIUM 8.6-50 MG PO TABS
2.0000 | ORAL_TABLET | Freq: Two times a day (BID) | ORAL | Status: DC
  Administered 2012-04-12 – 2012-04-13 (×2): 2 via ORAL
  Filled 2012-04-12 (×2): qty 2

## 2012-04-12 NOTE — Progress Notes (Signed)
[facility]ist Note  Subjective: Interval History: none.  Holly Rivera reports that she has had knee pain for many years and has wanted to get her knees operated on, but has not been able to afford to. Nurse practioner was called last night for an episode of confusion. UA  Was obtained and appears that patient has UTI. She was empirically started on cipro.   Objective: Vital signs in last 24 hours: Temp:  [97.5 F (36.4 C)-98.2 F (36.8 C)] 98.2 F (36.8 C) (02/16 0626) Pulse Rate:  [66-86] 66 (02/16 0626) Resp:  [18] 18 (02/16 0626) BP: (110-130)/(51-60) 110/51 mmHg (02/16 0626) SpO2:  [94 %-96 %] 94 % (02/16 0626)  PE:  Gen: Awake, alert, pleasant HENT; NCAT, EOMI Lungs: CTAB, no wheezing.  CVS: RR, NR, no murmur Abd: Soft, NT, ND, +BS Ext: large deformities to the right knee, likely due to longstanding OA, Limbs are thin, no acute effusion, bruising or erythema.  She also has changes to the left knee, though not as severe Pulses: 2+ and symmetric Neuro: Grossly normal, oriented to person, place and time.   Results for orders placed during the hospital encounter of 04/09/12 (from the past 24 hour(s))  CBC WITH DIFFERENTIAL     Status: Abnormal   Collection Time    04/12/12 10:32 AM      Result Value Range   WBC 9.5  4.0 - 10.5 K/uL   RBC 4.29  3.87 - 5.11 MIL/uL   Hemoglobin 13.2  12.0 - 15.0 g/dL   HCT 16.1  09.6 - 04.5 %   MCV 89.7  78.0 - 100.0 fL   MCH 30.8  26.0 - 34.0 pg   MCHC 34.3  30.0 - 36.0 g/dL   RDW 40.9  81.1 - 91.4 %   Platelets 210  150 - 400 K/uL   Neutrophils Relative 78 (*) 43 - 77 %   Neutro Abs 7.4  1.7 - 7.7 K/uL   Lymphocytes Relative 12  12 - 46 %   Lymphs Abs 1.1  0.7 - 4.0 K/uL   Monocytes Relative 10  3 - 12 %   Monocytes Absolute 1.0  0.1 - 1.0 K/uL   Eosinophils Relative 0  0 - 5 %   Eosinophils Absolute 0.0  0.0 - 0.7 K/uL   Basophils Relative 0  0 - 1 %   Basophils Absolute 0.0  0.0 - 0.1 K/uL  BASIC METABOLIC PANEL     Status:  Abnormal   Collection Time    04/12/12 10:32 AM      Result Value Range   Sodium 139  135 - 145 mEq/L   Potassium 3.9  3.5 - 5.1 mEq/L   Chloride 104  96 - 112 mEq/L   CO2 26  19 - 32 mEq/L   Glucose, Bld 130 (*) 70 - 99 mg/dL   BUN 18  6 - 23 mg/dL   Creatinine, Ser 7.82  0.50 - 1.10 mg/dL   Calcium 8.8  8.4 - 95.6 mg/dL   GFR calc non Af Amer 80 (*) >90 mL/min   GFR calc Af Amer >90  >90 mL/min    Studies/Results: Dg Elbow 2 Views Right  04/09/2012  *RADIOLOGY REPORT*  Clinical Data: Fall.  Elbow pain.  RIGHT ELBOW - 2 VIEW  Comparison: None.  Findings: Calcification noted along the elbow joint, potentially from chondrocalcinosis.  Indistinct anterior fat pad favors elbow joint effusion.  Prominent spurring along articular surfaces.  A discrete fracture is  not identified on this two views series.  IMPRESSION: 1.  Elbow joint effusion, with chondrocalcinosis.  Cannot exclude loose bodies in the joint.  2.  Prominent spurring.  3.  No fracture observed.   Original Report Authenticated By: Gaylyn Rong, M.D.    Dg Knee 2 Views Left  04/09/2012  *RADIOLOGY REPORT*  Clinical Data: Fall.  Knee pain.  LEFT KNEE - 1-2 VIEW  Comparison: None.  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  Prepatellar infiltrative edema noted.  Exam indeterminate for knee effusion due to the degree of flexion and spurring.  A 1.4 cm subcortical cystic lesion is present in the lateral tibial plateau, with a similar lesion along the tibial spine.  No overt fracture observed.  IMPRESSION:  1.  Severe tri-compartmental spurring loss of articular space, with scattered calcifications along the joint suspicious for synovial osteochondromatosis. 2.  No definite fracture. 3.  Prepatellar subcutaneous edema. 4.  Subcortical cystic lesions along the tibial plateau. 5.  Degree of cortical irregularity reduces sensitivity for nondisplaced  fractures.   Original Report Authenticated By: Gaylyn Rong, M.D.    Dg Knee 2 Views Right  04/09/2012  *RADIOLOGY REPORT*  Clinical Data: Fall.  Knee pain.  RIGHT KNEE - 1-2 VIEW  Comparison: None.  Findings: As on the contralateral side, there is severe spurring and loss articular space with calcifications along the joint. Possibilities include CPPD arthropathy or synovial osteochondromatosis.  Mild prepatellar subcutaneous edema noted. No discrete fracture.  IMPRESSION:  1.  No discrete fracture. 2.  Scattered synovial calcifications and possible chondrocalcinosis, query CPPD arthropathy and / or synovial osteochondromatosis. 3.  Severe spurring and loss articular space.   Original Report Authenticated By: Gaylyn Rong, M.D.    Ct Head Wo Contrast  04/10/2012  *RADIOLOGY REPORT*  Clinical Data: Status post fall; landed on right side.  Concern for head injury.  CT HEAD WITHOUT CONTRAST  Technique:  Contiguous axial images were obtained from the base of the skull through the vertex without contrast.  Comparison: None.  Findings: There is no evidence of acute infarction, mass lesion, or intra- or extra-axial hemorrhage on CT.  Prominence of the ventricles and sulci reflects mild cortical volume loss.  Scattered periventricular and subcortical white matter change likely reflects small vessel ischemic microangiopathy.  Mild cerebellar atrophy is noted.  The brainstem and fourth ventricle are within normal limits.  The basal ganglia are unremarkable in appearance.  The cerebral hemispheres demonstrate grossly normal gray-white differentiation. No mass effect or midline shift is seen.  There is no evidence of fracture; there is chronic degeneration at the right temporomandibular joint.  The visualized portions of the orbits are within normal limits.  The paranasal sinuses and mastoid air cells are well-aerated.  No significant soft tissue abnormalities are seen.  IMPRESSION:  1.  No evidence of traumatic  intracranial injury or fracture. 2.  Mild cortical volume loss and scattered small vessel ischemic microangiopathy. 3.  Chronic degeneration at the right temporomandibular joint.   Original Report Authenticated By: Tonia Ghent, M.D.     Scheduled Meds: . ciprofloxacin  250 mg Oral BID  . enoxaparin (LOVENOX) injection  30 mg Subcutaneous Q24H  . sodium chloride  3 mL Intravenous Q12H   Continuous Infusions:  PRN Meds:sodium chloride, acetaminophen, Muscle Rub, sodium chloride, traMADol  Assessment/Plan:  # Unable to ambulate, severe OA to knees:  Patient presented secondary to multiple falls and inability to ambulate.  -  PT/OT recommends SNF.  - She will need community/PCP assistance to get her teeth evaluated (she may not be able to afford this) and possibly have knee surgery, however, given her age, this may not be possible - Check labs today  UTI: Patient had an episode of confusion yesterday and night team was called. With the suspicion for UTI, labs were obtained and results are consistent with urinary infetion. Patient was empirically started on cipro. She is alert and talkative right now.  - Continue cipro - F/U  urine cultures  Disposition: Patient to be transferred to SNF on Monday.   LOS: 3 days   Lars Mage Pager [phone]  Please refer to [facility] on call on AMION for night coverage. 7 PM to 7 AM

## 2012-04-13 DIAGNOSIS — L03119 Cellulitis of unspecified part of limb: Secondary | ICD-10-CM

## 2012-04-13 LAB — URINE CULTURE

## 2012-04-13 MED ORDER — CIPROFLOXACIN HCL 250 MG PO TABS: 250.0000 mg | ORAL_TABLET | Freq: Two times a day (BID) | ORAL | Status: DC

## 2012-04-13 MED ORDER — ACETAMINOPHEN 325 MG PO TABS: 650.0000 mg | ORAL_TABLET | Freq: Four times a day (QID) | ORAL | Status: DC | PRN

## 2012-04-13 MED ORDER — MUSCLE RUB 10-15 % EX CREA: 1.0000 "application " | TOPICAL_CREAM | CUTANEOUS | Status: DC | PRN

## 2012-04-13 MED ORDER — TRAMADOL HCL 50 MG PO TABS: 50.0000 mg | ORAL_TABLET | Freq: Two times a day (BID) | ORAL | Status: DC | PRN

## 2012-04-13 NOTE — Clinical Social Work Note (Signed)
Clinical Social Worker spoke with daughter regarding bed offers received and daughter chose [facility]. CSW facilitated discharge by contacting facility, [facility] and family. [facility] will [facility] transported [facility]. Patient's discharge packet will be placed with shadow chart. will sign off, as social work intervention is no longer needed.   Rozetta Nunnery MSW, [employer] [phone]

## 2012-04-13 NOTE — Clinical Social Work Psychosocial (Addendum)
    Clinical Social Work Department BRIEF PSYCHOSOCIAL ASSESSMENT 04/13/2012  Patient:  Holly Rivera, Holly Rivera     Account Number:  [id number]     Admit date:  04/09/2012  Clinical Social Worker:  Hulan Fray  Date/Time:  04/13/2012 09:20 AM  Referred by:  Physician  Date Referred:  04/12/2012 Referred for  SNF Placement   Other Referral:   Interview type:  Family Other interview type:   Daughter- Holly Rivera    PSYCHOSOCIAL DATA Living Status:  ALONE Admitted from facility:   Level of care:   Primary support name:  Holly Rivera Primary support relationship to patient:  CHILD, ADULT Degree of support available:   supportive    CURRENT CONCERNS Current Concerns  Post-Acute Placement   Other Concerns:    SOCIAL WORK ASSESSMENT / PLAN Clinical Social Worker received referral for SNF placement at discharge. Patient is currently confused. CSW called daughter and explained reason for call. Daughter reported that patient was living alone but had frequent falls when she was living alone. Daughter reported that she is the only family around and explained that her husband passed suddenly, so she is doing everything by herself.Daughter requested to speak to CSW around 10:30am to discuss plans further. CSW will complete FL2 for MD's signature.   Assessment/plan status:  Psychosocial Support/Ongoing Assessment of Needs Other assessment/ plan:   10:46am CSW met with patient's daughter and daughter's friend Holly Rivera at bedside to discuss further SNF placement options. Daughter and Holly Rivera discussed interest in [facility] and [facility]. CSW also encouraged daughter to think about Medicaid application for potential long term placement if needed. Daughter and Holly Rivera discussed patient's financial's briefly and patient appeared to be making too much money for [employer]. Holly Rivera informed daughter that she had to go through same process with her mother and that the money will need to be  spent down, and that comforted daughter a little knowing her friend has some experience with this process. CSW will update daughter when bed offer is made.   Information/referral to community resources:   SNF packet will be given to daughter    PATIENT'S/FAMILY'S RESPONSE TO PLAN OF CARE: Daughter is agreeable to SNF placement at discharge. Daughter is not sure on whether placement will be long term vs short term placement.

## 2012-04-13 NOTE — Progress Notes (Signed)
Pt discharged in stable condition via EMS to [facility].  Discharge packet sent with pt and report called.  Holly Rivera

## 2012-04-13 NOTE — Discharge Summary (Signed)
Physician Discharge Summary  Holly Rivera ZOX:[id number] DOB: 04/23/1918 DOA: 04/09/2012  PCP: No primary provider on file.  Admit date: 04/09/2012 Discharge date: 04/13/2012  Recommendations for Outpatient Follow-up:  1. Pt will need to follow up with PCP in 2-3 weeks post discharge 2. Please see if pt may benefit from ortho consultation for ? Knee replacement if conservative management not adequate 3. Pt was discharged on 5 more days of Ciprofloxacin 4. Urine cultures pending upon discharge    Discharge Diagnoses:  Unable to ambulate, severe OA to knees Principal Problem:   Unable to ambulate Active Problems:   Frequent falls   Abrasion  Discharge Condition: Stable  Diet recommendation: Heart healthy diet discussed in details   History of present illness:  79 yo very healthy female who lives alone and who over the last several weeks has had numerous falls because "her knees are bad". She normally walks with a walker and says she tries to get up too fast and her knees are not able to hold her. She denies any head injuries but she has several abrasiions to her arms and legs. No fevers. No dysuria. No cp, no sob. No n/v/d. No illnesses. She says "if i could just get my knees fixed and my teeth fixed i could live by myself forever." She has been having issue with her knees for several years and has not had this evaluated. No slurred speech. No weakness in her upper ext but some in her legs. She has been thinking about moving into an assisted living place.  Hospital Course:  Unable to ambulate, severe OA to knees: Patient presented secondary to multiple falls and inability to ambulate.  - PT/OT recommends SNF, pt and family agrees - She will need community/PCP assistance to get her teeth evaluated (she may not be able to afford this) and possibly have knee surgery, however, given her 79, this may not be possible  - CBC and electrolyte panel is stable and within normal limits -  continue analgesia as needed for pain  UTI: Patient had an episode of confusion one day into the hospital stay and night team was called. With the suspicion for UTI, labs were obtained and results are consistent with urinary infetion. Patient was empirically started on cipro. She is alert and talkative right this AM. - Continue cipro for five more days post discharge  - F/U urine cultures as still pending upon discharge   Procedures/Studies: Dg Elbow 2 Views Right 04/09/2012    1.  Elbow joint effusion, with chondrocalcinosis.  Cannot exclude loose bodies in the joint.   2.  Prominent spurring.   3.  No fracture observed.   Dg Knee 2 Views Left 04/09/2012    1.  Severe tri-compartmental spurring loss of articular space, with scattered calcifications along the joint suspicious for synovial osteochondromatosis.  2.  No definite fracture.  3.  Prepatellar subcutaneous edema.  4.  Subcortical cystic lesions along the tibial plateau.  5.  Degree of cortical irregularity reduces sensitivity for nondisplaced fractures.   Dg Knee 2 Views Right 04/09/2012   1.  No discrete fracture.  2.  Scattered synovial calcifications and possible chondrocalcinosis, query CPPD arthropathy and / or synovial osteochondromatosis.  3.  Severe spurring and loss articular space.    Ct Head Wo Contrast 04/10/2012   1.  No evidence of traumatic intracranial injury or fracture.  2.  Mild cortical volume loss and scattered small vessel ischemic microangiopathy.  3.  Chronic degeneration  at the right temporomandibular joint.     Consultations:  None  Antibiotics:  Ciprofloxacin 02/16 --> 02/21  Discharge Exam: Filed Vitals:   04/13/12 0551  BP: 111/61  Pulse: 73  Temp: 98.2 F (36.8 C)  Resp: 18   Filed Vitals:   04/11/12 2150 04/12/12 0626 04/12/12 2231 04/13/12 0551  BP: 130/60 110/51 105/44 111/61  Pulse: 86 66 60 73  Temp: 97.5 F (36.4 C) 98.2 F (36.8 C) 97.9 F (36.6 C) 98.2 F (36.8 C)   TempSrc: Oral Oral Oral Oral  Resp: 18 18 18 18   Height:      Weight:      SpO2: 96% 94% 96% 97%    General: Pt is alert, follows commands appropriately, not in acute distress Cardiovascular: Regular rate and rhythm, S1/S2 +, no murmurs, no rubs, no gallops Respiratory: Clear to auscultation bilaterally, no wheezing, no crackles, no rhonchi Abdominal: Soft, non tender, non distended, bowel sounds +, no guarding Extremities: no edema, no cyanosis, pulses palpable bilaterally DP and PT Neuro: Grossly nonfocal  Discharge Instructions  Discharge Orders   Future Orders Complete By Expires     Diet - low sodium heart healthy  As directed     Increase activity slowly  As directed         Medication List    TAKE these medications       acetaminophen 325 MG tablet  Commonly known as:  TYLENOL  Take 2 tablets (650 mg total) by mouth every 6 (six) hours as needed.     ciprofloxacin 250 MG tablet  Commonly known as:  CIPRO  Take 1 tablet (250 mg total) by mouth 2 (two) times daily.     Muscle Rub 10-15 % Crea  Apply 1 application topically as needed.     OVER THE COUNTER MEDICATION  Take 1 tablet by mouth daily.     traMADol 50 MG tablet  Commonly known as:  ULTRAM  Take 1 tablet (50 mg total) by mouth every 12 (twelve) hours as needed for pain.           Follow-up Information   Follow up with [facility] EMERGENCY DEPARTMENT. (If symptoms worsen including fever, numbness, weakness, worsening pain)    Contact information:   [address] [id number] [place] [address] [phone]       The results of significant diagnostics from this hospitalization (including imaging, microbiology, ancillary and laboratory) are listed below for reference.     Microbiology: Recent Results (from the past 240 hour(s))  URINE CULTURE     Status: None   Collection Time    04/10/12  8:35 PM      Result Value Range Status   Specimen Description URINE,  RANDOM   Final   Special Requests NONE   Final   Culture  Setup Time 04/11/2012 05:35   Final   Colony Count >=100,000 COLONIES/ML   Final   Culture ESCHERICHIA COLI   Final   Report Status 04/13/2012 FINAL   Final   Organism ID, Bacteria ESCHERICHIA COLI   Final     Labs: Basic Metabolic Panel:  Recent Labs Lab 04/09/12 2048 04/12/12 1032  NA 140 139  K 3.9 3.9  CL 104 104  CO2 26 26  GLUCOSE 94 130*  BUN 17 18  CREATININE 0.50 0.54  CALCIUM 9.2 8.8   CBC:  Recent Labs Lab 04/09/12 2048 04/12/12 1032  WBC 7.0 9.5  NEUTROABS  --  7.4  HGB 14.8 13.2  HCT 42.9 38.5  MCV 89.6 89.7  PLT 263 210    SIGNED: Time coordinating discharge: Over 30 minutes  Debbora Presto, MD  [facility] 04/13/2012, 9:53 AM Pager [phone]  If 7PM-7AM, please contact night-coverage [email or web address] Password [email or web address]

## 2012-04-13 NOTE — Clinical Social Work Placement (Addendum)
    Clinical Social Work Department CLINICAL SOCIAL WORK PLACEMENT NOTE 04/13/2012  Patient:  Holly Rivera, Holly Rivera  Account Number:  [id number] Admit date:  04/09/2012  Clinical Social Worker:  Hulan Fray  Date/time:  04/13/2012 10:52 AM  Clinical Social Work is seeking post-discharge placement for this patient at the following level of care:   SKILLED NURSING   (*CSW will update this form in Epic as items are completed)   04/13/2012  Patient/family provided with [facility] Department of Clinical Social Work's list of facilities offering this level of care within the geographic area requested by the patient (or if unable, by the patient's family).  04/13/2012  Patient/family informed of their freedom to choose among providers that offer the needed level of care, that participate in Medicare, Medicaid or managed care program needed by the patient, have an available bed and are willing to accept the patient.  04/13/2012  Patient/family informed of [facility]' ownership interest in [facility], as well as of the fact that they are under no obligation to receive care at this facility.  PASARR submitted to EDS on 04/13/2012 PASARR number received from EDS on 04/13/2012  FL2 transmitted to all facilities in geographic area requested by pt/family on  04/13/2012 FL2 transmitted to all facilities within larger geographic area on   Patient informed that his/her managed care company has contracts with or will negotiate with  certain facilities, including the following:     Patient/family informed of bed offers received:  04-13-12 Patient chooses bed at [facility] Physician recommends and patient chooses bed at    Patient to be transferred to [facility] on 04-13-12  Patient to be transferred to facility by [facility]  The following physician request were entered in Epic:   Additional Comments:

## 2012-06-29 ENCOUNTER — Encounter: Payer: Self-pay | Admitting: Nurse Practitioner

## 2012-06-29 ENCOUNTER — Non-Acute Institutional Stay (SKILLED_NURSING_FACILITY): Payer: Medicare Other | Admitting: Nurse Practitioner

## 2012-06-29 DIAGNOSIS — M1710 Unilateral primary osteoarthritis, unspecified knee: Secondary | ICD-10-CM | POA: Insufficient documentation

## 2012-06-29 DIAGNOSIS — R413 Other amnesia: Secondary | ICD-10-CM | POA: Insufficient documentation

## 2012-06-29 DIAGNOSIS — IMO0002 Reserved for concepts with insufficient information to code with codable children: Secondary | ICD-10-CM | POA: Insufficient documentation

## 2012-06-29 NOTE — Assessment & Plan Note (Signed)
Stable on current medications 

## 2012-06-29 NOTE — Assessment & Plan Note (Signed)
Will get psych consult and labs at this time

## 2012-06-29 NOTE — Progress Notes (Signed)
Patient ID: Holly Rivera, female   DOB: [birth year], 79 y.o.   MRN: [id number]  Chief Complaint: medical management of chronic conditions   HPI:  79 year old female with PMH of frequent falls and OA is a long term resident of [place]. Family previously reported her mother has a history of being paranoid but refuses to seek medical attention. Has not had medical care in years. Question if pt has dementia. Currently pt has no complaints and staff currently has no concerns. OSTEOARTHRITIS  currently taking tramadol PRN and muscle rub to knees- reports she is not in pain    Review of Systems:  Review of Systems  Constitutional: Negative for fever, chills and weight loss.  HENT: Negative.   Eyes: Negative for blurred vision.  Respiratory: Negative for cough and shortness of breath.   Cardiovascular: Negative for chest pain, palpitations and leg swelling.  Gastrointestinal: Negative for abdominal pain, diarrhea and constipation.  Genitourinary: Negative for dysuria and urgency.  Musculoskeletal: Negative for myalgias and joint pain.  Skin: Negative.   Neurological: Negative for dizziness, tingling and weakness.  Psychiatric/Behavioral: Negative for depression and memory loss. The patient is not nervous/anxious and does not have insomnia.     Medications: Patient's Medications  New Prescriptions   No medications on file  Previous Medications   ACETAMINOPHEN (TYLENOL) 325 MG TABLET    Take 2 tablets (650 mg total) by mouth every 6 (six) hours as needed.   MENTHOL-METHYL SALICYLATE (MUSCLE RUB) 10-15 % CREA    Apply 1 application topically as needed.   TRAMADOL (ULTRAM) 50 MG TABLET    Take 1 tablet (50 mg total) by mouth every 12 (twelve) hours as needed for pain.  Modified Medications   No medications on file  Discontinued Medications   CIPROFLOXACIN (CIPRO) 250 MG TABLET    Take 1 tablet (250 mg total) by mouth 2 (two) times daily.   OVER THE COUNTER MEDICATION    Take 1 tablet by  mouth daily.     Physical Exam: Physical Exam  Nursing note and vitals reviewed. Constitutional: She is oriented to person, place, and time and well-developed, well-nourished, and in no distress. No distress.  HENT:  Head: Normocephalic and atraumatic.  Eyes: Conjunctivae and EOM are normal. Pupils are equal, round, and reactive to light.  Neck: Normal range of motion. Neck supple.  Cardiovascular: Normal rate, regular rhythm, normal heart sounds and intact distal pulses.   Pulmonary/Chest: Effort normal and breath sounds normal.  Abdominal: Soft. Bowel sounds are normal.  Musculoskeletal: She exhibits no edema and no tenderness.  Neurological: She is alert and oriented to person, place, and time.  Skin: Skin is warm and dry. She is not diaphoretic.     Filed Vitals:   06/29/12 1822  BP: 111/74  Pulse: 97  Temp: 97.5 F (36.4 C)  Resp: 20  Height: 4\' 10"  (1.473 m)  Weight: 141 lb (63.957 kg)      Labs reviewed: Basic Metabolic Panel:  Recent Labs  40/98/11 2048 04/12/12 1032  NA 140 139  K 3.9 3.9  CL 104 104  CO2 26 26  GLUCOSE 94 130*  BUN 17 18  CREATININE 0.50 0.54  CALCIUM 9.2 8.8    Liver Function Tests: No results found for this basename: AST, ALT, ALKPHOS, BILITOT, PROT, ALBUMIN,  in the last 8760 hours  CBC:  Recent Labs  04/09/12 2048 04/12/12 1032  WBC 7.0 9.5  NEUTROABS  --  7.4  HGB 14.8  13.2  HCT 42.9 38.5  MCV 89.6 89.7  PLT 263 210     Assessment/Plan Osteoarthrosis, unspecified whether generalized or localized, lower leg Stable on current medications  Memory loss Will get psych consult and labs at this time   Labs/tests ordered TSH, CBC, CMP

## 2012-08-03 ENCOUNTER — Non-Acute Institutional Stay (SKILLED_NURSING_FACILITY): Payer: Medicare Other | Admitting: Nurse Practitioner

## 2012-08-03 ENCOUNTER — Encounter: Payer: Self-pay | Admitting: Nurse Practitioner

## 2012-08-03 DIAGNOSIS — R413 Other amnesia: Secondary | ICD-10-CM

## 2012-08-03 DIAGNOSIS — F29 Unspecified psychosis not due to a substance or known physiological condition: Secondary | ICD-10-CM

## 2012-08-03 DIAGNOSIS — M1710 Unilateral primary osteoarthritis, unspecified knee: Secondary | ICD-10-CM

## 2012-08-03 DIAGNOSIS — F323 Major depressive disorder, single episode, severe with psychotic features: Secondary | ICD-10-CM | POA: Insufficient documentation

## 2012-08-03 NOTE — Progress Notes (Signed)
Patient ID: Holly Rivera, female   DOB: 1918/05/01, 79 y.o.   MRN: [id number]  Nursing Home Location:  [facility]   Place of Service: SNF (31)   Chief Complaint: Medical Managment of Chronic Issues   HPI:  79 year old female with PMH of frequent falls and OA is a long term resident of [place]. currently pt has no complaints and staff currently has no concerns. Psych services is following pt- pt is currently stable without any acute concerns OSTEOARTHRITIS currently taking tramadol PRN and muscle rub to knees- reports she is not in pain    Review of Systems:  Review of Systems  Constitutional: Negative.   HENT: Negative.   Respiratory: Negative.   Cardiovascular: Negative.   Gastrointestinal: Negative.   Genitourinary: Negative.   Musculoskeletal: Negative.        Generalized weakness  Skin: Negative.   Neurological: Negative.   Psychiatric/Behavioral: Positive for memory loss. The patient is nervous/anxious.      Medications: Patient's Medications  New Prescriptions   No medications on file  Previous Medications   ACETAMINOPHEN (TYLENOL) 325 MG TABLET    Take 2 tablets (650 mg total) by mouth every 6 (six) hours as needed.   MENTHOL-METHYL SALICYLATE (MUSCLE RUB) 10-15 % CREA    Apply 1 application topically as needed.   TRAMADOL (ULTRAM) 50 MG TABLET    Take 1 tablet (50 mg total) by mouth every 12 (twelve) hours as needed for pain.  Modified Medications   No medications on file  Discontinued Medications   No medications on file     Physical Exam:  Filed Vitals:   08/03/12 1001  BP: 134/68  Pulse: 70  Temp: 97.6 F (36.4 C)  Resp: 20    Physical Exam  Nursing note and vitals reviewed. Constitutional: She appears well-developed and well-nourished. No distress.  HENT:  Head: Normocephalic and atraumatic.  Mouth/Throat: Oropharynx is clear and moist.  Eyes: EOM are normal. Pupils are equal, round, and reactive to light.  Neck:  Normal range of motion.  Cardiovascular: Normal rate, regular rhythm and normal heart sounds.   Pulmonary/Chest: Effort normal and breath sounds normal. No respiratory distress.  Abdominal: Soft. Bowel sounds are normal.  Musculoskeletal: Normal range of motion.  Self propels in [facility]  Neurological: She is alert.  Skin: Skin is warm and dry. She is not diaphoretic. No erythema. No pallor.  Psychiatric: She has a normal mood and affect.  Has episodes of agitatation      Assessment/Plan Psychosis Seen by psych services no recommendations at this time. Will cont to monitor pt  Memory loss Patients memory loss is stable; continue current regimen. Will monitor and make changes as necessary.   Osteoarthrosis, unspecified whether generalized or localized, lower leg Pt with no complaints of pain at present- has cont PRNS

## 2012-08-03 NOTE — Assessment & Plan Note (Signed)
Seen by psych services no recommendations at this time. Will cont to monitor pt

## 2012-08-03 NOTE — Assessment & Plan Note (Signed)
Pt with no complaints of pain at present- has cont PRNS

## 2012-08-03 NOTE — Assessment & Plan Note (Signed)
Patients memory loss is stable; continue current regimen. Will monitor and make changes as necessary.

## 2012-09-03 ENCOUNTER — Encounter: Payer: Self-pay | Admitting: Nurse Practitioner

## 2012-09-03 ENCOUNTER — Non-Acute Institutional Stay (SKILLED_NURSING_FACILITY): Payer: Medicare Other | Admitting: Nurse Practitioner

## 2012-09-03 DIAGNOSIS — F29 Unspecified psychosis not due to a substance or known physiological condition: Secondary | ICD-10-CM

## 2012-09-03 DIAGNOSIS — M1710 Unilateral primary osteoarthritis, unspecified knee: Secondary | ICD-10-CM

## 2012-09-03 DIAGNOSIS — IMO0002 Reserved for concepts with insufficient information to code with codable children: Secondary | ICD-10-CM

## 2012-09-03 DIAGNOSIS — R413 Other amnesia: Secondary | ICD-10-CM

## 2012-09-03 NOTE — Progress Notes (Signed)
Patient ID: Holly Rivera, female   DOB: 1918/12/07, 79 y.o.   MRN: [id number]  Nursing Home Location:  [facility]   Place of Service: SNF (31)   Chief Complaint: medical management of chronic conditions  HPI:  79 year old female with PMH of OA and frequent falls therefore she is a long term resident of [place]. currently pt has no complaints and staff currently has no concerns. Psych services is following pt- pt is currently stable without any complaints. Staff without concerns.  OSTEOARTHRITIS currently taking tramadol PRN and muscle rub to knees- reports she is not in pain    Review of Systems:  DATA OBTAINED: from patient, nurse, medical record GENERAL: Feels well no fevers, fatigue, appetite changes SKIN: No itching, rash or wounds EYES: No eye pain, redness, discharge EARS: No earache, tinnitus, change in hearing NOSE: No congestion, drainage or bleeding  MOUTH/THROAT: No mouth or tooth pain, No sore throat, No difficulty chewing or swallowing  RESPIRATORY: No cough, wheezing, SOB CARDIAC: No chest pain, palpitations, lower extremity edema  GI: No abdominal pain, No N/V/D or constipation, No heartburn or reflux  GU: No dysuria, frequency or urgency, or incontinence  MUSCULOSKELETAL: No unrelieved bone/joint pain NEUROLOGIC: Awake, alert, appropriate to situation, No change in mental status. Moves all four, no focal deficits PSYCHIATRIC: No overt anxiety or sadness. Sleeps well. No behavior issue.  AMBULATION: self propels in wheelchair     Medications: Patient's Medications  New Prescriptions   No medications on file  Previous Medications   ACETAMINOPHEN (TYLENOL) 325 MG TABLET    Take 2 tablets (650 mg total) by mouth every 6 (six) hours as needed.   MENTHOL-METHYL SALICYLATE (MUSCLE RUB) 10-15 % CREA    Apply 1 application topically as needed.   TRAMADOL (ULTRAM) 50 MG TABLET    Take 1 tablet (50 mg total) by mouth every 12 (twelve) hours as needed  for pain.  Modified Medications   No medications on file  Discontinued Medications   No medications on file     Physical Exam:  Filed Vitals:   09/03/12 1502  BP: 120/62  Pulse: 60  Temp: 97.1 F (36.2 C)  Resp: 20   Physical Exam GENERAL APPEARANCE: Alert, conversant.  No acute distress.  SKIN: No diaphoresis rash, or wounds HEAD: Normocephalic, atraumatic  EYES: Conjunctiva/lids clear. Pupils round, reactive. EOMs intact.  EARS: External exam WNL, canals clear. Hearing grossly normal.  NOSE: No deformity or discharge.  MOUTH/THROAT: Lips w/o lesions. Mouth and throat normal. Tongue moist, w/o lesion.  NECK: No thyroid tenderness, enlargement or nodule  RESPIRATORY: Breathing is even, unlabored. Lung sounds are clear   CARDIOVASCULAR: Heart RRR no murmurs, rubs or gallops. No peripheral edema.  ARTERIAL: radial pulse 2+, DP pulse 1+  GASTROINTESTINAL: Abdomen is soft, non-tender, not distended w/ normal bowel sounds. GENITOURINARY: Bladder non tender, not distended  MUSCULOSKELETAL: No abnormal joints or musculature NEUROLOGIC: Oriented X3. Cranial nerves 2-12 grossly intact. Moves all extremities no tremor. PSYCHIATRIC: Mood and affect appropriate to situation, no behavioral issues  Labs reviewed: CBC NO Diff (Complete Blood Count)       Result: 06/30/2012 12:41 PM    ( Status: F )            WBC  6.1        4.0-10.5  K/uL  SLN       RBC  4.08        3.87-5.11  MIL/uL  SLN  Hemoglobin  11.9     L  12.0-15.0  g/dL  SLN       Hematocrit  34.5     L  36.0-46.0  %  SLN       MCV  84.6        78.0-100.0  fL  SLN       MCH  29.2        26.0-34.0  pg  SLN       MCHC  34.5        30.0-36.0  g/dL  SLN       RDW  40.9        11.5-15.5  %  SLN       Platelet Count  269        150-400  K/uL  SLN      Comprehensive Metabolic Panel       Result: 06/30/2012 12:40 PM    ( Status: F )            Sodium  142        135-145  mEq/L  SLN       Potassium  4.1        3.5-5.3  mEq/L  SLN        Chloride  108        96-112  mEq/L  SLN       CO2  28        19-32  mEq/L  SLN       Glucose  85        70-99  mg/dL  SLN       BUN  23        6-23  mg/dL  SLN       Creatinine  0.59        0.50-1.10  mg/dL  SLN       Bilirubin, Total  0.4        0.3-1.2  mg/dL  SLN       Alkaline Phosphatase  51        39-117  U/L  SLN       AST/SGOT  14        0-37  U/L  SLN       ALT/SGPT  12        0-35  U/L  SLN       Total Protein  5.1     L  6.0-8.3  g/dL  SLN       Albumin  3.0     L  3.5-5.2  g/dL  SLN       Calcium  8.5        8.4-10.5  mg/dL  SLN      TSH, Ultrasensitive       Result: 06/30/2012 12:50 PM    ( Status: F )            TSH  2.441             Assessment/Plan  Memory loss  Patients memory loss is stable; continue current regimen. Will monitor and make changes as necessary.  Osteoarthrosis, unspecified whether generalized or localized, lower leg  Pt with no complaints of pain at present- has cont PRNS Psychosis- stable

## 2012-10-05 ENCOUNTER — Encounter: Payer: Self-pay | Admitting: Nurse Practitioner

## 2012-10-05 ENCOUNTER — Non-Acute Institutional Stay (SKILLED_NURSING_FACILITY): Payer: Medicare Other | Admitting: Nurse Practitioner

## 2012-10-05 DIAGNOSIS — M1710 Unilateral primary osteoarthritis, unspecified knee: Secondary | ICD-10-CM

## 2012-10-05 DIAGNOSIS — R413 Other amnesia: Secondary | ICD-10-CM

## 2012-10-05 NOTE — Progress Notes (Signed)
Patient ID: Holly Rivera, female   DOB: 26-Oct-1918, 79 y.o.   MRN: [id number]  Nursing Home Location:  [facility]   Place of Service: SNF (31)  Chief Complaint  Patient presents with  . Medical Managment of Chronic Issues    HPI:  79 year old female with PMH of OA and frequent falls who is a long term resident of [place]. currently pt has no complaints and staff currently has no concerns. No recent falls per nursing pt is only on medication for OA which she does not need most of the time. Pt is pain free and reports her biggest issues is that she eats too much.  Review of Systems:   DATA OBTAINED: from patient, nurse, medical record GENERAL: Feels well no fevers, fatigue, appetite changes SKIN: No itching, rash or wounds EYES: No eye pain, redness, discharge EARS: No earache, tinnitus, change in hearing NOSE: No congestion, drainage or bleeding  MOUTH/THROAT: No mouth or tooth pain, No sore throat, No difficulty chewing or swallowing  RESPIRATORY: No cough, wheezing, SOB CARDIAC: No chest pain, palpitations, lower extremity edema  GI: No abdominal pain, No N/V/D or constipation, No heartburn or reflux  GU: No dysuria, frequency or urgency, or incontinence  MUSCULOSKELETAL: No unrelieved bone/joint pain NEUROLOGIC: Awake, alert, appropriate to situation, No change in mental status. Moves all four, no focal deficits PSYCHIATRIC: No overt anxiety or sadness. Sleeps well. No behavior issue.  AMBULATION:  Self propels in WC   Medications: Patient's Medications  New Prescriptions   No medications on file  Previous Medications   ACETAMINOPHEN (TYLENOL) 325 MG TABLET    Take 2 tablets (650 mg total) by mouth every 6 (six) hours as needed.   MENTHOL-METHYL SALICYLATE (MUSCLE RUB) 10-15 % CREA    Apply 1 application topically as needed.   TRAMADOL (ULTRAM) 50 MG TABLET    Take 1 tablet (50 mg total) by mouth every 12 (twelve) hours as needed for pain.  Modified  Medications   No medications on file  Discontinued Medications   No medications on file     Physical Exam:  Filed Vitals:   10/05/12 1611  BP: 114/66  Pulse: 72  Temp: 97.6 F (36.4 C)  Resp: 18      GENERAL APPEARANCE: Alert, conversant. Appropriately groomed. No acute distress.  SKIN: No diaphoresis rash, or wounds HEAD: Normocephalic, atraumatic  EYES: Conjunctiva/lids clear. Pupils round, reactive. EOMs intact.  EARS: External exam WNL. Hearing grossly normal.  NOSE: No deformity or discharge.  MOUTH/THROAT: Lips w/o lesions. Mouth and throat normal. Tongue moist, w/o lesion.  NECK: No thyroid tenderness, enlargement or nodule  RESPIRATORY: Breathing is even, unlabored. Lung sounds are clear   CARDIOVASCULAR: Heart RRR no murmurs, rubs or gallops. No peripheral edema.  ARTERIAL: radial pulse 2+ VGASTROINTESTINAL: Abdomen is soft, non-tender, not distended w/ normal bowel sounds. GENITOURINARY: Bladder non tender, not distended  MUSCULOSKELETAL: No abnormal joints or musculature NEUROLOGIC: Oriented X3. Cranial nerves 2-12 grossly intact. Moves all extremities no tremor. PSYCHIATRIC: Mood and affect appropriate to situation, no behavioral issues  Labs reviewed/Significant Diagnostic Results: CBC NO Diff (Complete Blood Count)  Result: 06/30/2012 12:41 PM ( Status: F )  WBC 6.1 4.0-10.5 K/uL SLN  RBC 4.08 3.87-5.11 MIL/uL SLN  Hemoglobin 11.9 L 12.0-15.0 g/dL SLN  Hematocrit 72.5 L 36.0-46.0 % SLN  MCV 84.6 78.0-100.0 fL SLN  MCH 29.2 26.0-34.0 pg SLN  MCHC 34.5 30.0-36.0 g/dL SLN  RDW 36.6 44.0-34.7 % SLN  Platelet Count 269 150-400 K/uL SLN  Comprehensive Metabolic Panel  Result: 06/30/2012 12:40 PM ( Status: F )  Sodium 142 135-145 mEq/L SLN  Potassium 4.1 3.5-5.3 mEq/L SLN  Chloride 108 96-112 mEq/L SLN  CO2 28 19-32 mEq/L SLN  Glucose 85 70-99 mg/dL SLN  BUN 23 1-61 mg/dL SLN  Creatinine 0.96 0.45-4.09 mg/dL SLN  Bilirubin, Total 0.4 0.3-1.2 mg/dL SLN   Alkaline Phosphatase 51 39-117 U/L SLN  AST/SGOT 14 0-37 U/L SLN  ALT/SGPT 12 0-35 U/L SLN  Total Protein 5.1 L 6.0-8.3 g/dL SLN  Albumin 3.0 L 8.1-1.9 g/dL SLN  Calcium 8.5 1.4-78.2 mg/dL SLN  TSH, Ultrasensitive  Result: 06/30/2012 12:50 PM ( Status: F )  TSH 2.441      Assessment/Plan  Memory loss  Stable  Osteoarthrosis, unspecified whether generalized or localized, lower leg  stable  No changes over the last month and overall pt cont to do very well in current enviroment

## 2012-10-15 ENCOUNTER — Other Ambulatory Visit: Payer: Self-pay | Admitting: Geriatric Medicine

## 2012-10-15 MED ORDER — TRAMADOL HCL 50 MG PO TABS: 50.0000 mg | ORAL_TABLET | Freq: Two times a day (BID) | ORAL | Status: DC | PRN

## 2012-11-02 ENCOUNTER — Non-Acute Institutional Stay (SKILLED_NURSING_FACILITY): Payer: Medicare Other | Admitting: Nurse Practitioner

## 2012-11-02 DIAGNOSIS — R413 Other amnesia: Secondary | ICD-10-CM

## 2012-11-02 DIAGNOSIS — M1710 Unilateral primary osteoarthritis, unspecified knee: Secondary | ICD-10-CM

## 2012-11-02 DIAGNOSIS — F329 Major depressive disorder, single episode, unspecified: Secondary | ICD-10-CM

## 2012-11-15 DIAGNOSIS — F329 Major depressive disorder, single episode, unspecified: Secondary | ICD-10-CM | POA: Insufficient documentation

## 2012-11-15 NOTE — Progress Notes (Signed)
Patient ID: Holly Rivera, female   DOB: 1918/03/11, 79 y.o.   MRN: [id number]   Nursing Home Location:  [facility]   Place of Service: SNF (31)  Chief Complaint  Patient presents with  . Medical Managment of Chronic Issues    HPI:  79 year old female with PMH of OA and frequent falls who is a long term resident of [place]. currently pt has no complaints and staff currently has no concerns.    pt started on zoloft 25 mg daily for depression; tolerating medication without side effects   Review of Systems:  DATA OBTAINED: from patient, nurse, medical record  GENERAL:no fevers, fatigue, appetite changes  SKIN: No itching, rash or wounds  EYES: No eye pain, redness, discharge  EARS: No earache, tinnitus, change in hearing  NOSE: No congestion, drainage or bleeding  MOUTH/THROAT: No mouth or tooth pain, No sore throat, No difficulty chewing or swallowing  RESPIRATORY: No cough, wheezing, SOB  CARDIAC: No chest pain, palpitations, lower extremity edema  GI: No abdominal pain, No N/V/D or constipation, No heartburn or reflux  GU: No dysuria, frequency or urgency, or incontinence  MUSCULOSKELETAL: No unrelieved bone/joint pain  NEUROLOGIC: Awake, alert, appropriate to situation, No change in mental status. Moves all four, no focal deficits  PSYCHIATRIC: No overt anxiety or sadness. Sleeps well. No behavior issue.  AMBULATION: Self propels in WC    Medications: Patient's Medications  New Prescriptions   No medications on file  Previous Medications   ACETAMINOPHEN (TYLENOL) 325 MG TABLET    Take 2 tablets (650 mg total) by mouth every 6 (six) hours as needed.   MENTHOL-METHYL SALICYLATE (MUSCLE RUB) 10-15 % CREA    Apply 1 application topically as needed.   SERTRALINE (ZOLOFT) 25 MG TABLET    Take 25 mg by mouth daily.   TRAMADOL (ULTRAM) 50 MG TABLET    Take 1 tablet (50 mg total) by mouth every 12 (twelve) hours as needed for pain.  Modified Medications   No medications on file  Discontinued Medications   No medications on file     Physical Exam:  Filed Vitals:   11/02/12 1421  BP: 132/70  Pulse: 72  Temp: 98.1 F (36.7 C)  Resp: 20   GENERAL APPEARANCE: Alert, conversant. Appropriately groomed. No acute distress.  SKIN: No diaphoresis rash, or wounds  HEAD: Normocephalic, atraumatic  EYES: Conjunctiva/lids clear. Pupils round, reactive. EOMs intact.  EARS: External exam WNL. Hearing grossly normal.  NOSE: No deformity or discharge.  MOUTH/THROAT: Lips w/o lesions. Mouth and throat normal. Tongue moist, w/o lesion.  NECK: No thyroid tenderness, enlargement or nodule  RESPIRATORY: Breathing is even, unlabored. Lung sounds are clear  CARDIOVASCULAR: Heart RRR no murmurs, rubs or gallops. No peripheral edema.  ARTERIAL: radial pulse 2+  VGASTROINTESTINAL: Abdomen is obese, soft, non-tender, not distended w/ normal bowel sounds. GENITOURINARY: Bladder non tender, not distended  MUSCULOSKELETAL: No abnormal joints or musculature  NEUROLOGIC: Oriented X3. Cranial nerves 2-12 grossly intact. Moves all extremities no tremor.  PSYCHIATRIC: Mood and affect appropriate to situation, no behavioral issues  Labs reviewed/Significant Diagnostic Results: CBC NO Diff (Complete Blood Count)  Result: 06/30/2012 12:41 PM ( Status: F )  WBC 6.1 4.0-10.5 K/uL SLN  RBC 4.08 3.87-5.11 MIL/uL SLN  Hemoglobin 11.9 L 12.0-15.0 g/dL SLN  Hematocrit 40.9 L 36.0-46.0 % SLN  MCV 84.6 78.0-100.0 fL SLN  MCH 29.2 26.0-34.0 pg SLN  MCHC 34.5 30.0-36.0 g/dL SLN  RDW 15.3  11.5-15.5 % SLN  Platelet Count 269 150-400 K/uL SLN  Comprehensive Metabolic Panel  Result: 06/30/2012 12:40 PM ( Status: F )  Sodium 142 135-145 mEq/L SLN  Potassium 4.1 3.5-5.3 mEq/L SLN  Chloride 108 96-112 mEq/L SLN  CO2 28 19-32 mEq/L SLN  Glucose 85 70-99 mg/dL SLN  BUN 23 1-61 mg/dL SLN  Creatinine 0.96 0.45-4.09 mg/dL SLN  Bilirubin, Total 0.4 0.3-1.2 mg/dL SLN  Alkaline  Phosphatase 51 39-117 U/L SLN  AST/SGOT 14 0-37 U/L SLN  ALT/SGPT 12 0-35 U/L SLN  Total Protein 5.1 L 6.0-8.3 g/dL SLN  Albumin 3.0 L 8.1-1.9 g/dL SLN  Calcium 8.5 1.4-78.2 mg/dL SLN  TSH, Ultrasensitive  Result: 06/30/2012 12:50 PM ( Status: F )  TSH 2.441      Assessment/Plan 1. Depression Stable on zoloft 2. Memory loss Stable; will cont to monitor 3. Osteoarthritis Does not require medications  Will request for pt to be put on dental list Labs/tests ordered Cbc, bmp  Attempted to call pts daughter for update and to answer any questions; no answer; left msg to call back

## 2012-11-18 ENCOUNTER — Encounter: Payer: Self-pay | Admitting: Nurse Practitioner

## 2012-11-18 ENCOUNTER — Non-Acute Institutional Stay (SKILLED_NURSING_FACILITY): Payer: Medicare Other | Admitting: Nurse Practitioner

## 2012-11-18 DIAGNOSIS — M25519 Pain in unspecified shoulder: Secondary | ICD-10-CM

## 2012-11-18 NOTE — Progress Notes (Signed)
Patient ID: Holly Rivera, female   DOB: 02/04/1919, 79 y.o.   MRN: [id number]  Nursing Home Location:  [facility]   Place of Service: SNF (31)  Chief Complaint  Patient presents with  . Acute Visit    HPI:  79 year old female with a PHM of OA and frequent falls at night is a LTR at [place] is being seen today due to witnessed fall in the shower room. Nursing requested pt to be seen due to complaining of back pain all over.  Pt was trying to transfer in shower room and slipped and slit to the floor hitting only her bottom on the floor.  Pt reports pain is a 5/10; not intense but sore feeling under both shoulders. Denies any pain on the spine. Reports the pain is mild and does not need any medications; has full ROM.  Review of Systems:   DATA OBTAINED: from patient GENERAL: Feels well SKIN: No abrasion or bruising RESPIRATORY: No cough, wheezing, SOB CARDIAC: No chest pain, palpitations GI: No abdominal pain MUSCULOSKELETAL: mild pain to back below shoulder blades NEUROLOGIC: Awake, alert, appropriate to situation, No change in mental status. Moves all four, no focal deficits AMBULATION:  WC and transfers self   Medications: Patient's Medications  New Prescriptions   No medications on file  Previous Medications   ACETAMINOPHEN (TYLENOL) 325 MG TABLET    Take 2 tablets (650 mg total) by mouth every 6 (six) hours as needed.   SERTRALINE (ZOLOFT) 25 MG TABLET    Take 25 mg by mouth daily.  Modified Medications   No medications on file  Discontinued Medications   MENTHOL-METHYL SALICYLATE (MUSCLE RUB) 10-15 % CREA    Apply 1 application topically as needed.   TRAMADOL (ULTRAM) 50 MG TABLET    Take 1 tablet (50 mg total) by mouth every 12 (twelve) hours as needed for pain.     Physical Exam:  Filed Vitals:   11/18/12 1854  BP: 136/75  Pulse: 75  Temp: 97.6 F (36.4 C)  Resp: 20    GENERAL APPEARANCE: Alert, conversant. Appropriately groomed. No  acute distress.  SKIN: No abrasions or bruising noted  HEAD: Normocephalic, atraumatic  RESPIRATORY: Breathing is even, unlabored. Lung sounds are clear   CARDIOVASCULAR: Heart RRR no murmurs, rubs or gallops. No peripheral edema.  ARTERIAL: radial pulse 2+ MUSCULOSKELETAL: No abnormal joints or musculature; no tenderness to spine or paraspinal muscles  NEUROLOGIC: Oriented X3. Cranial nerves 2-12 grossly intact. Moves all extremities no tremor. PSYCHIATRIC: Mood and affect appropriate to situation, no behavioral issues   Assessment/Plan 1. Pain in joint, shoulder region, unspecified laterality S/p fall; pt landed on buttocks however only pain is in shoulder region; does not feel like she needs medication; exam WNL for pt; no changes noted; will cont to monitor no further intervention needed at this time.

## 2012-11-30 ENCOUNTER — Encounter: Payer: Self-pay | Admitting: Nurse Practitioner

## 2012-11-30 ENCOUNTER — Non-Acute Institutional Stay (SKILLED_NURSING_FACILITY): Payer: Medicare Other | Admitting: Nurse Practitioner

## 2012-11-30 DIAGNOSIS — M1710 Unilateral primary osteoarthritis, unspecified knee: Secondary | ICD-10-CM

## 2012-11-30 DIAGNOSIS — R296 Repeated falls: Secondary | ICD-10-CM

## 2012-11-30 DIAGNOSIS — Z9181 History of falling: Secondary | ICD-10-CM

## 2012-11-30 DIAGNOSIS — F329 Major depressive disorder, single episode, unspecified: Secondary | ICD-10-CM

## 2012-11-30 NOTE — Progress Notes (Signed)
Patient ID: Holly Rivera, female   DOB: April 27, 1918, 79 y.o.   MRN: [id number]     Allergies  Allergen Reactions  . Penicillins Anaphylaxis    Chief Complaint  Patient presents with  . Medical Managment of Chronic Issues    HPI:  79 year old female with a PHM of OA and frequent falls at night is a LTR at [place] is being seen today for routine follow up; pt had fall 2 weeks ago; no further pain noted; pt reports she wishes to have therapy eval due to fall; not getting enough exercise with just rolling herself around in the wheelchair is not enough activity  Pt was on zoloft but this was stopped because she reports she "id not need that medication" always in a good mood; no anxiety or depression  No complaints per pt and nursing without concern  Review of Systems:  Review of Systems  Constitutional: Negative for fever, chills and weight loss.  HENT: Negative.   Eyes: Negative for blurred vision.  Respiratory: Negative for cough and shortness of breath.   Cardiovascular: Negative for chest pain, palpitations and leg swelling.  Gastrointestinal: Negative for abdominal pain, diarrhea and constipation.  Genitourinary: Negative for dysuria and urgency.  Musculoskeletal: Negative for myalgias and joint pain.  Skin: Negative.   Neurological: Negative for dizziness, tingling and weakness.  Psychiatric/Behavioral: Negative for depression and memory loss. The patient is not nervous/anxious and does not have insomnia.     Past Medical History  Diagnosis Date  . Pneumonia   . Arthritis    Past Surgical History  Procedure Laterality Date  . Appendectomy     Social History:   reports that she has never smoked. She does not have any smokeless tobacco history on file. She reports that she does not drink alcohol or use illicit drugs.  No family history on file.  Medications: Patient's Medications  New Prescriptions   No medications on file  Previous Medications   ACETAMINOPHEN (TYLENOL) 325 MG TABLET    Take 2 tablets (650 mg total) by mouth every 6 (six) hours as needed.  Modified Medications   No medications on file  Discontinued Medications   SERTRALINE (ZOLOFT) 25 MG TABLET    Take 25 mg by mouth daily.     Physical Exam:  Filed Vitals:   11/30/12 1143  BP: 126/84  Pulse: 90  Temp: 97 F (36.1 C)  Resp: 18    Physical Exam  Nursing note and vitals reviewed. Constitutional: She is well-developed, well-nourished, and in no distress. No distress.  HENT:  Head: Normocephalic and atraumatic.  Mouth/Throat: Oropharynx is clear and moist. No oropharyngeal exudate.  Eyes: Conjunctivae and EOM are normal. Pupils are equal, round, and reactive to light.  Neck: Normal range of motion. Neck supple. No thyromegaly present.  Cardiovascular: Normal rate, regular rhythm and normal heart sounds.   Pulmonary/Chest: Effort normal and breath sounds normal.  Abdominal: Soft. Bowel sounds are normal.  Musculoskeletal: She exhibits no edema and no tenderness.  Neurological: She is alert.  Skin: Skin is warm and dry. She is not diaphoretic.     Labs reviewed: Basic Metabolic Panel:  Recent Labs  14/78/29 2048 04/12/12 1032  NA 140 139  K 3.9 3.9  CL 104 104  CO2 26 26  GLUCOSE 94 130*  BUN 17 18  CREATININE 0.50 0.54  CALCIUM 9.2 8.8   Liver Function Tests: No results found for this basename: AST, ALT, ALKPHOS, BILITOT, PROT, ALBUMIN,  in the last 8760 hours No results found for this basename: LIPASE, AMYLASE,  in the last 8760 hours No results found for this basename: AMMONIA,  in the last 8760 hours CBC:  Recent Labs  04/09/12 2048 04/12/12 1032  WBC 7.0 9.5  NEUTROABS  --  7.4  HGB 14.8 13.2  HCT 42.9 38.5  MCV 89.6 89.7  PLT 263 210   CBC NO Diff (Complete Blood Count)       Result: 06/30/2012 12:41 PM    ( Status: F )            WBC  6.1        4.0-10.5  K/uL  SLN       RBC  4.08        3.87-5.11  MIL/uL  SLN        Hemoglobin  11.9     L  12.0-15.0  g/dL  SLN       Hematocrit  34.5     L  36.0-46.0  %  SLN       MCV  84.6        78.0-100.0  fL  SLN       MCH  29.2        26.0-34.0  pg  SLN       MCHC  34.5        30.0-36.0  g/dL  SLN       RDW  04.5        11.5-15.5  %  SLN       Platelet Count  269        150-400  K/uL  SLN      Comprehensive Metabolic Panel       Result: 06/30/2012 12:40 PM    ( Status: F )            Sodium  142        135-145  mEq/L  SLN       Potassium  4.1        3.5-5.3  mEq/L  SLN       Chloride  108        96-112  mEq/L  SLN       CO2  28        19-32  mEq/L  SLN       Glucose  85        70-99  mg/dL  SLN       BUN  23        6-23  mg/dL  SLN       Creatinine  0.59        0.50-1.10  mg/dL  SLN       Bilirubin, Total  0.4        0.3-1.2  mg/dL  SLN       Alkaline Phosphatase  51        39-117  U/L  SLN       AST/SGOT  14        0-37  U/L  SLN       ALT/SGPT  12        0-35  U/L  SLN       Total Protein  5.1     L  6.0-8.3  g/dL  SLN       Albumin  3.0     L  3.5-5.2  g/dL  SLN       Calcium  8.5  8.4-10.5  mg/dL  SLN      TSH, Ultrasensitive       Result: 06/30/2012 12:50 PM    ( Status: F )            TSH  2.441        0.350-4.500  uIU/mL     CBC NO Diff (Complete Blood Count)       Result: 11/04/2012 2:36 PM    ( Status: F )            WBC  5.5        4.0-10.5  K/uL  SLN       RBC  4.22        3.87-5.11  MIL/uL  SLN       Hemoglobin  12.5        12.0-15.0  g/dL  SLN       Hematocrit  36.1        36.0-46.0  %  SLN       MCV  85.5        78.0-100.0  fL  SLN       MCH  29.6        26.0-34.0  pg  SLN       MCHC  34.6        30.0-36.0  g/dL  SLN       RDW  16.1        11.5-15.5  %  SLN       Platelet Count  273        150-400  K/uL  SLN      Basic Metabolic Panel       Result: 11/04/2012 1:31 PM    ( Status: F )            Sodium  139        135-145  mEq/L  SLN       Potassium  4.3        3.5-5.3  mEq/L  SLN       Chloride  105        96-112  mEq/L  SLN        CO2  27        19-32  mEq/L  SLN       Glucose  85        70-99  mg/dL  SLN       BUN  21        6-23  mg/dL  SLN       Creatinine  0.48     L  0.50-1.10  mg/dL  SLN       Calcium  8.6        8.4-10.5  mg/dL  SLN       Assessment/Plan 1. Osteoarthrosis, unspecified whether generalized or localized, lower leg Stable; pt reports she would like more therapy due to fall; will order therapy to eval and treat  2. Depression Stable; reports she does not need medication will cont to monitor  3. Frequent falls Falls freq at home; fall 2 weeks ago at [facility]; no ongoing pain noted.   Labs reviewed

## 2012-12-30 ENCOUNTER — Non-Acute Institutional Stay (SKILLED_NURSING_FACILITY): Payer: Medicare Other | Admitting: Nurse Practitioner

## 2012-12-30 ENCOUNTER — Encounter: Payer: Self-pay | Admitting: Nurse Practitioner

## 2012-12-30 DIAGNOSIS — R413 Other amnesia: Secondary | ICD-10-CM

## 2012-12-30 DIAGNOSIS — B372 Candidiasis of skin and nail: Secondary | ICD-10-CM

## 2012-12-30 DIAGNOSIS — F29 Unspecified psychosis not due to a substance or known physiological condition: Secondary | ICD-10-CM

## 2012-12-30 DIAGNOSIS — M1710 Unilateral primary osteoarthritis, unspecified knee: Secondary | ICD-10-CM

## 2012-12-30 NOTE — Progress Notes (Signed)
Patient ID: Holly Rivera, female   DOB: 01-29-1919, 79 y.o.   MRN: [id number] Nursing Home Location:  [facility]   Place of Service: SNF (31)  PCP: No primary provider on file.  Code Status: FULL  Allergies  Allergen Reactions  . Penicillins Anaphylaxis    Chief Complaint  Patient presents with  . Medical Managment of Chronic Issues    HPI:  79 year old female with a PHM of OA and frequent falls, is at [place] long term and is being seen today for routine follow up; No complaints per pt and nursing without concern   Review of Systems:  Review of Systems  Constitutional: Negative for fever, chills and weight loss.  Eyes: Negative for blurred vision.  Respiratory: Negative for cough and shortness of breath.   Cardiovascular: Negative for chest pain, palpitations and leg swelling.  Gastrointestinal: Negative for heartburn, abdominal pain and constipation.  Genitourinary: Negative for dysuria.  Musculoskeletal: Negative for joint pain and myalgias.  Skin: Negative.   Neurological: Negative for dizziness, tingling, weakness and headaches.  Psychiatric/Behavioral: Negative for depression. The patient is not nervous/anxious and does not have insomnia.      Past Medical History  Diagnosis Date  . Pneumonia   . Arthritis    Past Surgical History  Procedure Laterality Date  . Appendectomy     Social History:   reports that she has never smoked. She does not have any smokeless tobacco history on file. She reports that she does not drink alcohol or use illicit drugs.  No family history on file.  Medications: Patient's Medications  New Prescriptions   No medications on file  Previous Medications   ACETAMINOPHEN (TYLENOL) 325 MG TABLET    Take 2 tablets (650 mg total) by mouth every 6 (six) hours as needed.  Modified Medications   No medications on file  Discontinued Medications   No medications on file     Physical Exam:  Filed Vitals:    12/30/12 1647  BP: 110/47  Pulse: 76  Temp: 96.3 F (35.7 C)  Resp: 20  Weight: 160 lb (72.576 kg)    Physical Exam  Vitals reviewed. Constitutional: She is well-developed, well-nourished, and in no distress. No distress.  HENT:  Head: Normocephalic and atraumatic.  Eyes: Conjunctivae and EOM are normal. Pupils are equal, round, and reactive to light.  Neck: Normal range of motion. Neck supple. No thyromegaly present.  Cardiovascular: Normal rate, regular rhythm and normal heart sounds.   Pulmonary/Chest: Effort normal and breath sounds normal. No respiratory distress. She has no wheezes.  Abdominal: Soft. Bowel sounds are normal. She exhibits no distension. There is no tenderness.  Musculoskeletal: She exhibits no edema and no tenderness.  Self propels in WC able to stand and sit; does not walk but a few steps at a time  Lymphadenopathy:    She has no cervical adenopathy.  Neurological: She is alert.  Skin: Skin is warm. She is not diaphoretic.  Redness to abdominal fold and in groin area     Labs reviewed: Basic Metabolic Panel:  Recent Labs  40/98/11 2048 04/12/12 1032  NA 140 139  K 3.9 3.9  CL 104 104  CO2 26 26  GLUCOSE 94 130*  BUN 17 18  CREATININE 0.50 0.54  CALCIUM 9.2 8.8   Liver Function Tests: No results found for this basename: AST, ALT, ALKPHOS, BILITOT, PROT, ALBUMIN,  in the last 8760 hours No results found for this basename: LIPASE,  AMYLASE,  in the last 8760 hours No results found for this basename: AMMONIA,  in the last 8760 hours CBC:  Recent Labs  04/09/12 2048 04/12/12 1032  WBC 7.0 9.5  NEUTROABS  --  7.4  HGB 14.8 13.2  HCT 42.9 38.5  MCV 89.6 89.7  PLT 263 210   CBC NO Diff (Complete Blood Count)  Result: 06/30/2012 12:41 PM ( Status: F )  WBC 6.1 4.0-10.5 K/uL SLN  RBC 4.08 3.87-5.11 MIL/uL SLN  Hemoglobin 11.9 L 12.0-15.0 g/dL SLN  Hematocrit 16.1 L 36.0-46.0 % SLN  MCV 84.6 78.0-100.0 fL SLN  MCH 29.2 26.0-34.0 pg SLN   MCHC 34.5 30.0-36.0 g/dL SLN  RDW 09.6 04.5-40.9 % SLN  Platelet Count 269 150-400 K/uL SLN  Comprehensive Metabolic Panel  Result: 06/30/2012 12:40 PM ( Status: F )  Sodium 142 135-145 mEq/L SLN  Potassium 4.1 3.5-5.3 mEq/L SLN  Chloride 108 96-112 mEq/L SLN  CO2 28 19-32 mEq/L SLN  Glucose 85 70-99 mg/dL SLN  BUN 23 8-11 mg/dL SLN  Creatinine 9.14 7.82-9.56 mg/dL SLN  Bilirubin, Total 0.4 0.3-1.2 mg/dL SLN  Alkaline Phosphatase 51 39-117 U/L SLN  AST/SGOT 14 0-37 U/L SLN  ALT/SGPT 12 0-35 U/L SLN  Total Protein 5.1 L 6.0-8.3 g/dL SLN  Albumin 3.0 L 2.1-3.0 g/dL SLN  Calcium 8.5 8.6-57.8 mg/dL SLN  TSH, Ultrasensitive  Result: 06/30/2012 12:50 PM ( Status: F )  TSH 2.441 0.350-4.500 uIU/mL  CBC NO Diff (Complete Blood Count)  Result: 11/04/2012 2:36 PM ( Status: F )  WBC 5.5 4.0-10.5 K/uL SLN  RBC 4.22 3.87-5.11 MIL/uL SLN  Hemoglobin 12.5 12.0-15.0 g/dL SLN  Hematocrit 46.9 62.9-52.8 % SLN  MCV 85.5 78.0-100.0 fL SLN  MCH 29.6 26.0-34.0 pg SLN  MCHC 34.6 30.0-36.0 g/dL SLN  RDW 41.3 24.4-01.0 % SLN  Platelet Count 273 150-400 K/uL SLN  Basic Metabolic Panel  Result: 11/04/2012 1:31 PM ( Status: F )  Sodium 139 135-145 mEq/L SLN  Potassium 4.3 3.5-5.3 mEq/L SLN  Chloride 105 96-112 mEq/L SLN  CO2 27 19-32 mEq/L SLN  Glucose 85 70-99 mg/dL SLN  BUN 21 2-72 mg/dL SLN  Creatinine 5.36 L 0.50-1.10 mg/dL SLN  Calcium 8.6 6.4-40.3 mg/dL SLN     Assessment/Plan 1. Psychosis Occasionally will have outburst that are not able to be redirected but for the majority of the time pt is stable with appropriate mood and behaviors. Pt refuses medications because she does not feel like she needs them at this time.   2. Memory loss Stable; not currently on medications  3. Osteoarthrosis, unspecified whether generalized or localized, lower leg Stable without pain; has tylenol PRN but does not need  4. Yeast To apply nystatin BID to skin folds for 14 days then as needed

## 2013-02-02 ENCOUNTER — Encounter: Payer: Self-pay | Admitting: Nurse Practitioner

## 2013-02-02 ENCOUNTER — Non-Acute Institutional Stay (SKILLED_NURSING_FACILITY): Payer: Medicare Other | Admitting: Nurse Practitioner

## 2013-02-02 DIAGNOSIS — F329 Major depressive disorder, single episode, unspecified: Secondary | ICD-10-CM

## 2013-02-02 DIAGNOSIS — M1710 Unilateral primary osteoarthritis, unspecified knee: Secondary | ICD-10-CM

## 2013-02-02 DIAGNOSIS — F29 Unspecified psychosis not due to a substance or known physiological condition: Secondary | ICD-10-CM

## 2013-02-02 NOTE — Progress Notes (Signed)
Patient ID: Holly Rivera, female   DOB: Sep 16, 1918, 79 y.o.   MRN: [id number].    Nursing Home Location:  [facility]   Place of Service: SNF (31)  PCP: No primary provider on file.  Allergies  Allergen Reactions  . Penicillins Anaphylaxis    Chief Complaint  Patient presents with  . Medical Managment of Chronic Issues    HPI:  79 year old female with a PHM of OA and frequent falls at home; no recenet falls; pt is being seen today for routine follow up; No complaints per pt and nursing without concern   Review of Systems:  Review of Systems  Constitutional: Negative for fever, chills and weight loss.  Eyes: Negative for blurred vision.  Respiratory: Negative for cough and shortness of breath.   Cardiovascular: Negative for chest pain, palpitations and leg swelling.  Gastrointestinal: Negative for heartburn, abdominal pain and constipation.  Genitourinary: Negative for dysuria.  Musculoskeletal: Negative for joint pain and myalgias.  Skin: Negative.  Negative for rash.  Neurological: Negative for dizziness, tingling, weakness and headaches.  Psychiatric/Behavioral: Negative for depression. The patient is not nervous/anxious and does not have insomnia.      Past Medical History  Diagnosis Date  . Pneumonia   . Arthritis    Past Surgical History  Procedure Laterality Date  . Appendectomy     Social History:   reports that she has never smoked. She does not have any smokeless tobacco history on file. She reports that she does not drink alcohol or use illicit drugs.  No family history on file.  Medications: Patient's Medications  New Prescriptions   No medications on file  Previous Medications   ACETAMINOPHEN (TYLENOL) 325 MG TABLET    Take 2 tablets (650 mg total) by mouth every 6 (six) hours as needed.  Modified Medications   No medications on file  Discontinued Medications   No medications on file     Physical Exam:  Filed Vitals:     02/02/13 1506  BP: 110/66  Pulse: 92  Temp: 98.1 F (36.7 C)  Resp: 20   Constitutional: She is well-developed, well-nourished, and in no distress. No distress.  HENT:  Head: Normocephalic and atraumatic.  Eyes: Conjunctivae and EOM are normal. Pupils are equal, round, and reactive to light.  Neck: Normal range of motion. Neck supple. No thyromegaly present.  Mouth: poor dentition  Cardiovascular: Normal rate, regular rhythm and normal heart sounds.  Pulmonary/Chest: Effort normal and breath sounds normal. No respiratory distress. She has no wheezes.  Abdominal: Soft. Bowel sounds are normal. She exhibits no distension. There is no tenderness.  Musculoskeletal: She exhibits no edema and no tenderness.  Self propels in WC able to stand, sit and pivots  Lymphadenopathy:  She has no cervical adenopathy.  Neurological: She is alert.  Skin: Skin is warm. She is not diaphoretic.     Labs reviewed:  Recent Labs  04/09/12 2048 04/12/12 1032  NA 140 139  K 3.9 3.9  CL 104 104  CO2 26 26  GLUCOSE 94 130*  BUN 17 18  CREATININE 0.50 0.54  CALCIUM 9.2 8.8    Recent Labs  04/09/12 2048 04/12/12 1032  WBC 7.0 9.5  NEUTROABS  --  7.4  HGB 14.8 13.2  HCT 42.9 38.5  MCV 89.6 89.7  PLT 263 210   CBC NO Diff (Complete Blood Count)  Result: 06/30/2012 12:41 PM ( Status: F )  WBC 6.1 4.0-10.5 K/uL SLN  RBC 4.08 3.87-5.11 MIL/uL SLN  Hemoglobin 11.9 L 12.0-15.0 g/dL SLN  Hematocrit 78.2 L 36.0-46.0 % SLN  MCV 84.6 78.0-100.0 fL SLN  MCH 29.2 26.0-34.0 pg SLN  MCHC 34.5 30.0-36.0 g/dL SLN  RDW 95.6 21.3-08.6 % SLN  Platelet Count 269 150-400 K/uL SLN  Comprehensive Metabolic Panel  Result: 06/30/2012 12:40 PM ( Status: F )  Sodium 142 135-145 mEq/L SLN  Potassium 4.1 3.5-5.3 mEq/L SLN  Chloride 108 96-112 mEq/L SLN  CO2 28 19-32 mEq/L SLN  Glucose 85 70-99 mg/dL SLN  BUN 23 5-78 mg/dL SLN  Creatinine 4.69 6.29-5.28 mg/dL SLN  Bilirubin, Total 0.4 0.3-1.2 mg/dL SLN   Alkaline Phosphatase 51 39-117 U/L SLN  AST/SGOT 14 0-37 U/L SLN  ALT/SGPT 12 0-35 U/L SLN  Total Protein 5.1 L 6.0-8.3 g/dL SLN  Albumin 3.0 L 4.1-3.2 g/dL SLN  Calcium 8.5 4.4-01.0 mg/dL SLN  TSH, Ultrasensitive  Result: 06/30/2012 12:50 PM ( Status: F )  TSH 2.441 0.350-4.500 uIU/mL  CBC NO Diff (Complete Blood Count)  Result: 11/04/2012 2:36 PM ( Status: F )  WBC 5.5 4.0-10.5 K/uL SLN  RBC 4.22 3.87-5.11 MIL/uL SLN  Hemoglobin 12.5 12.0-15.0 g/dL SLN  Hematocrit 27.2 53.6-64.4 % SLN  MCV 85.5 78.0-100.0 fL SLN  MCH 29.6 26.0-34.0 pg SLN  MCHC 34.6 30.0-36.0 g/dL SLN  RDW 03.4 74.2-59.5 % SLN  Platelet Count 273 150-400 K/uL SLN  Basic Metabolic Panel  Result: 11/04/2012 1:31 PM ( Status: F )  Sodium 139 135-145 mEq/L SLN  Potassium 4.3 3.5-5.3 mEq/L SLN  Chloride 105 96-112 mEq/L SLN  CO2 27 19-32 mEq/L SLN  Glucose 85 70-99 mg/dL SLN  BUN 21 6-38 mg/dL SLN  Creatinine 7.56 L 0.50-1.10 mg/dL SLN  Calcium 8.6 4.3-32.9 mg/dL SLN      Assessment/Plan 1. Osteoarthrosis, unspecified whether generalized or localized, lower leg -no pain noted; self propels in WC  2. Psychosis -hx of paranoia however pt is able to make own decisions and does not wish to take medications for anxiety/depression or psychosis, occasional behaviors but over all stable  3. Depression -remains stable; off medications due to non adherence   4. Health maintenance  -will place pt on dental list

## 2013-02-15 ENCOUNTER — Encounter: Payer: Self-pay | Admitting: Internal Medicine

## 2013-02-15 ENCOUNTER — Non-Acute Institutional Stay (SKILLED_NURSING_FACILITY): Payer: Medicare Other | Admitting: Internal Medicine

## 2013-02-15 DIAGNOSIS — F0391 Unspecified dementia with behavioral disturbance: Secondary | ICD-10-CM

## 2013-02-15 DIAGNOSIS — R071 Chest pain on breathing: Secondary | ICD-10-CM

## 2013-02-15 DIAGNOSIS — R0789 Other chest pain: Secondary | ICD-10-CM

## 2013-02-19 ENCOUNTER — Encounter: Payer: Self-pay | Admitting: Internal Medicine

## 2013-02-19 ENCOUNTER — Non-Acute Institutional Stay (SKILLED_NURSING_FACILITY): Payer: Medicare Other | Admitting: Internal Medicine

## 2013-02-19 DIAGNOSIS — R0982 Postnasal drip: Secondary | ICD-10-CM

## 2013-02-19 DIAGNOSIS — J329 Chronic sinusitis, unspecified: Secondary | ICD-10-CM

## 2013-02-19 NOTE — Progress Notes (Signed)
MRN: [id number] Name: Holly Rivera  Sex: female Age: 79 y.o. DOB: 1918-08-25  [facility] #: [facility] Facility/Room: 103a Level Of Care: SNF Provider: Merrilee Seashore D Emergency Contacts: Extended Emergency Contact Information Primary Emergency Contact: Gagnon,Anita Address: [address]          [name] [address] [place] Home Phone: [phone] Relation: None  Code Status: full  Allergies: Penicillins  Chief Complaint  Patient presents with  . Acute Visit    HPI: Patient is 79 y.o. female who THE NURSE ASKED ME TO SEE BECAUSE SHE C/O CONGESTION.  Past Medical History  Diagnosis Date  . Pneumonia   . Arthritis     Past Surgical History  Procedure Laterality Date  . Appendectomy        Medication List       This list is accurate as of: 02/19/13  2:06 PM.  Always use your most recent med list.               acetaminophen 325 MG tablet  Commonly known as:  TYLENOL  Take 2 tablets (650 mg total) by mouth every 6 (six) hours as needed.     nystatin powder  Commonly known as:  MYCOSTATIN  Apply topically 2 (two) times daily as needed. abd folds and thighs        No orders of the defined types were placed in this encounter.    Immunization History  Administered Date(s) Administered  . Influenza-Unspecified 11/25/2012    History  Substance Use Topics  . Smoking status: Never Smoker   . Smokeless tobacco: Not on file  . Alcohol Use: No    Review of Systems  DATA OBTAINED: from patient, nurse GENERAL: Feels well no fevers, fatigue, appetite changes SKIN: No itching, rash HEENT: c/o post nasal drainage, thick, not coughing, not blowing nose, would like muccinex RESPIRATORY: No cough, wheezing, SOB CARDIAC: No chest pain, palpitations, lower extremity edema  GI: No abdominal pain, No N/V/D or constipation, No heartburn or reflux  GU: No dysuria, frequency or urgency, or incontinence  MUSCULOSKELETAL: No unrelieved bone/joint  pain NEUROLOGIC: No headache, dizziness or focal weakness PSYCHIATRIC: No overt anxiety or sadness. Sleeps well.   Filed Vitals:   02/19/13 1403  BP: 139/60  Pulse: 76  Resp: 20    Physical Exam  GENERAL APPEARANCE: Alert, conversant. Appropriately groomed. No acute distress  SKIN: No diaphoresis rash, or wounds HEENT: Unremarkable RESPIRATORY: Breathing is even, unlabored. Lung sounds are clear   CARDIOVASCULAR: Heart RRR no murmurs, rubs or gallops. No peripheral edema  GASTROINTESTINAL: Abdomen is soft, non-tender, not distended w/ normal bowel sounds.  GENITOURINARY: Bladder non tender, not distended  MUSCULOSKELETAL: No abnormal joints or musculature NEUROLOGIC: Cranial nerves 2-12 grossly intact. Moves all extremities no tremor. PSYCHIATRIC: Mood and affect appropriate to situation, no behavioral issues  Patient Active Problem List   Diagnosis Date Noted  . Dementia with behavioral disturbance 02/15/2013  . Depression 11/15/2012  . Psychosis 08/03/2012  . Osteoarthrosis, unspecified whether generalized or localized, lower leg 06/29/2012  . Memory loss 06/29/2012  . Unable to ambulate 04/09/2012  . Frequent falls 04/09/2012  . Abrasion 04/09/2012    CBC    Component Value Date/Time   WBC 9.5 04/12/2012 1032   RBC 4.29 04/12/2012 1032   HGB 13.2 04/12/2012 1032   HCT 38.5 04/12/2012 1032   PLT 210 04/12/2012 1032   MCV 89.7 04/12/2012 1032   LYMPHSABS 1.1 04/12/2012 1032   MONOABS 1.0  04/12/2012 1032   EOSABS 0.0 04/12/2012 1032   BASOSABS 0.0 04/12/2012 1032    CMP     Component Value Date/Time   NA 139 04/12/2012 1032   K 3.9 04/12/2012 1032   CL 104 04/12/2012 1032   CO2 26 04/12/2012 1032   GLUCOSE 130* 04/12/2012 1032   BUN 18 04/12/2012 1032   CREATININE 0.54 04/12/2012 1032   CALCIUM 8.8 04/12/2012 1032   GFRNONAA 80* 04/12/2012 1032   GFRAA >90 04/12/2012 1032    Assessment and Plan  POST NASAL DRAINAGE- muccinex 2 tsp q4 prn; prob viral, no abx at this  time  Margit Hanks, MD

## 2013-02-19 NOTE — Progress Notes (Signed)
MRN: [id number] Name: Holly Rivera  Sex: female Age: 79 y.o. DOB: 17-Sep-1918  [facility] #: [facility] Facility/Room: 103A Level Of Care: SNF Provider: Merrilee Seashore D Emergency Contacts: Extended Emergency Contact Information Primary Emergency Contact: Gagnon,Anita Address: [address]          [name] [address] [place] Home Phone: [phone] Relation: None  Code StatusFULL  Allergies: Penicillins  Chief Complaint  Patient presents with  . Acute Visit    HPI: Patient is 79 y.o. female who the nurses have asked me to see because she c/o r post chest pain with breathing.  Past Medical History  Diagnosis Date  . Pneumonia   . Arthritis     Past Surgical History  Procedure Laterality Date  . Appendectomy        Medication List       This list is accurate as of: 02/15/13 11:59 PM.  Always use your most recent med list.               acetaminophen 325 MG tablet  Commonly known as:  TYLENOL  Take 2 tablets (650 mg total) by mouth every 6 (six) hours as needed.     nystatin powder  Commonly known as:  MYCOSTATIN  Apply topically 2 (two) times daily as needed. abd folds and thighs        Meds ordered this encounter  Medications  . nystatin (MYCOSTATIN) powder    Sig: Apply topically 2 (two) times daily as needed. abd folds and thighs    Immunization History  Administered Date(s) Administered  . Influenza-Unspecified 11/25/2012    History  Substance Use Topics  . Smoking status: Never Smoker   . Smokeless tobacco: Not on file  . Alcohol Use: No    Review of Systems  DATA OBTAINED: from patient, nurse GENERAL: Feels well no fevers, fatigue, appetite changes; nurse reported that earlier pt hadn't gotten out of bed like usual but pt is playing bingo in cafeteria now SKIN: No itching, rash HEENT: No complaint RESPIRATORY: No cough, wheezing, SOB; pt said her r back hurt earlier with breathing but is not hurting now at  all CARDIAC: No chest pain, palpitations, lower extremity edema  GI: No abdominal pain, No N/V/D or constipation, No heartburn or reflux  GU: No dysuria, frequency or urgency, or incontinence  MUSCULOSKELETAL: No unrelieved bone/joint pain NEUROLOGIC: No headache, dizziness or focal weakness PSYCHIATRIC: No overt anxiety or sadness. Sleeps well.   Filed Vitals:   02/15/13 1507  BP: 99/51  Pulse: 89  Temp: 97.8 F (36.6 C)  Resp: 19    Physical Exam  GENERAL APPEARANCE: Alert, conversant. Appropriately groomed. No acute distress , playing bingo SKIN: No diaphoresis rash, or wounds HEENT: Unremarkable RESPIRATORY: Breathing is even, unlabored. Lung sounds are clear  PULSE OX AT TABLE SIDE PER ADAMD WAS 96% WITH A PULSE OF 90 CARDIOVASCULAR: Heart RRR no murmurs, rubs or gallops. No peripheral edema  GASTROINTESTINAL: Abdomen is soft, non-tender, not distended w/ normal bowel sounds.  GENITOURINARY: Bladder non tender, not distended  MUSCULOSKELETAL: No abnormal joints or musculature NEUROLOGIC: Cranial nerves 2-12 grossly intact. Moves all extremities no tremor. PSYCHIATRIC: Mood and affect appropriate to situation, no behavioral issues  Patient Active Problem List   Diagnosis Date Noted  . Dementia with behavioral disturbance 02/15/2013  . Depression 11/15/2012  . Psychosis 08/03/2012  . Osteoarthrosis, unspecified whether generalized or localized, lower leg 06/29/2012  . Memory loss 06/29/2012  . Unable to ambulate  04/09/2012  . Frequent falls 04/09/2012  . Abrasion 04/09/2012    CBC    Component Value Date/Time   WBC 9.5 04/12/2012 1032   RBC 4.29 04/12/2012 1032   HGB 13.2 04/12/2012 1032   HCT 38.5 04/12/2012 1032   PLT 210 04/12/2012 1032   MCV 89.7 04/12/2012 1032   LYMPHSABS 1.1 04/12/2012 1032   MONOABS 1.0 04/12/2012 1032   EOSABS 0.0 04/12/2012 1032   BASOSABS 0.0 04/12/2012 1032    CMP     Component Value Date/Time   NA 139 04/12/2012 1032   K 3.9 04/12/2012  1032   CL 104 04/12/2012 1032   CO2 26 04/12/2012 1032   GLUCOSE 130* 04/12/2012 1032   BUN 18 04/12/2012 1032   CREATININE 0.54 04/12/2012 1032   CALCIUM 8.8 04/12/2012 1032   GFRNONAA 80* 04/12/2012 1032   GFRAA >90 04/12/2012 1032    Assessment and Plan  POSTERIOR CHEST WALL PAIN - with report of acting sick earlier;CXR ordered and was negative and pt is acting like her usual self now and no CP at [employer], Randon Goldsmith, MD

## 2013-02-26 ENCOUNTER — Non-Acute Institutional Stay (SKILLED_NURSING_FACILITY): Payer: Medicare Other | Admitting: Nurse Practitioner

## 2013-02-26 DIAGNOSIS — B379 Candidiasis, unspecified: Secondary | ICD-10-CM

## 2013-02-26 DIAGNOSIS — IMO0002 Reserved for concepts with insufficient information to code with codable children: Secondary | ICD-10-CM

## 2013-02-26 DIAGNOSIS — R05 Cough: Secondary | ICD-10-CM

## 2013-02-26 DIAGNOSIS — R059 Cough, unspecified: Secondary | ICD-10-CM

## 2013-02-26 DIAGNOSIS — M1710 Unilateral primary osteoarthritis, unspecified knee: Secondary | ICD-10-CM

## 2013-02-26 DIAGNOSIS — F03918 Unspecified dementia, unspecified severity, with other behavioral disturbance: Secondary | ICD-10-CM

## 2013-02-26 DIAGNOSIS — F0391 Unspecified dementia with behavioral disturbance: Secondary | ICD-10-CM

## 2013-02-26 NOTE — Progress Notes (Signed)
Patient ID: Holly Rivera, female   DOB: [birth year], 80 y.o.   MRN: [id number]    Nursing Home Location:  [facility]   Place of Service: SNF (31)  PCP: No primary provider on file.  Allergies  Allergen Reactions  . Penicillins Anaphylaxis    Chief Complaint  Patient presents with  . Medical Managment of Chronic Issues    HPI:  80 year old female with a PHM of OA and frequent falls at home; no recenet falls; pt is being seen today for routine follow up and cough Pt was seen last week due to post nasal drip but reports this has gotten no better and now having shortness of breath at times. No fevers or chills, no wheezing  Review of Systems:  Review of Systems  Constitutional: Negative for fever, chills and weight loss.  Eyes: Negative for blurred vision.  Respiratory: Positive for cough and shortness of breath. Negative for sputum production.        Can not get sputum up  Cardiovascular: Negative for chest pain, palpitations and leg swelling.  Gastrointestinal: Negative for heartburn, abdominal pain and constipation.  Genitourinary: Negative for dysuria.  Musculoskeletal: Negative for joint pain and myalgias.  Skin: Negative.  Negative for rash.  Neurological: Negative for dizziness, tingling, weakness and headaches.  Psychiatric/Behavioral: Negative for depression. The patient is not nervous/anxious and does not have insomnia.      Past Medical History  Diagnosis Date  . Pneumonia   . Arthritis    Past Surgical History  Procedure Laterality Date  . Appendectomy     Social History:   reports that she has never smoked. She does not have any smokeless tobacco history on file. She reports that she does not drink alcohol or use illicit drugs.  No family history on file.  Medications: Patient's Medications  New Prescriptions   No medications on file  Previous Medications   ACETAMINOPHEN (TYLENOL) 325 MG TABLET    Take 2 tablets (650 mg total)  by mouth every 6 (six) hours as needed.   NYSTATIN (MYCOSTATIN) POWDER    Apply topically 2 (two) times daily as needed. abd folds and thighs  Modified Medications   No medications on file  Discontinued Medications   No medications on file     Physical Exam: Physical Exam  Vitals reviewed. Constitutional: She is well-developed, well-nourished, and in no distress. No distress.  HENT:  Head: Normocephalic and atraumatic.  Eyes: Conjunctivae and EOM are normal. Pupils are equal, round, and reactive to light.  Neck: Normal range of motion. Neck supple. No thyromegaly present.  Cardiovascular: Normal rate, regular rhythm and normal heart sounds.   Pulmonary/Chest: Effort normal and breath sounds normal. No respiratory distress. She has no wheezes.  Rhonchi to right lower lobe  Abdominal: Soft. Bowel sounds are normal. She exhibits no distension. There is no tenderness.  Musculoskeletal: She exhibits no edema and no tenderness.  Self propels in WC able to stand and sit; does not walk but a few steps at a time  Lymphadenopathy:    She has no cervical adenopathy.  Neurological: She is alert.  Skin: Skin is warm. She is not diaphoretic.  Redness to abdominal fold and in groin area     Filed Vitals:   02/26/13 1254  BP: 139/60  Pulse: 76  Temp: 97 F (36.1 C)  Resp: 20      Labs reviewed: Basic Metabolic Panel:  Recent Labs  40/98/1102/13/14 2048 04/12/12 1032  NA 140 139  K 3.9 3.9  CL 104 104  CO2 26 26  GLUCOSE 94 130*  BUN 17 18  CREATININE 0.50 0.54  CALCIUM 9.2 8.8   Liver Function Tests: No results found for this basename: AST, ALT, ALKPHOS, BILITOT, PROT, ALBUMIN,  in the last 8760 hours No results found for this basename: LIPASE, AMYLASE,  in the last 8760 hours No results found for this basename: AMMONIA,  in the last 8760 hours CBC:  Recent Labs  04/09/12 2048 04/12/12 1032  WBC 7.0 9.5  NEUTROABS  --  7.4  HGB 14.8 13.2  HCT 42.9 38.5  MCV 89.6 89.7    PLT 263 210    Assessment/Plan 1. Dementia with behavioral disturbance -unchanged  2. Osteoarthrosis, unspecified whether generalized or localized, lower leg -stable at this time  3. Cough -xray was neg last week however due to worsening congestion and rhonchi will follow up xray -mucinex DM BID for 14 days with full glass of water -encouraged fluids -will get CBC and BMP  4. Candidiasis -encouraged pt to keep area under abdominal fold clean and dry -may use nystatin powder as needed

## 2013-03-17 ENCOUNTER — Encounter: Payer: Self-pay | Admitting: Internal Medicine

## 2013-03-17 ENCOUNTER — Non-Acute Institutional Stay (SKILLED_NURSING_FACILITY): Payer: Medicare Other | Admitting: Internal Medicine

## 2013-03-17 DIAGNOSIS — IMO0002 Reserved for concepts with insufficient information to code with codable children: Secondary | ICD-10-CM

## 2013-03-17 DIAGNOSIS — F03918 Unspecified dementia, unspecified severity, with other behavioral disturbance: Secondary | ICD-10-CM

## 2013-03-17 DIAGNOSIS — R05 Cough: Secondary | ICD-10-CM

## 2013-03-17 DIAGNOSIS — F32A Depression, unspecified: Secondary | ICD-10-CM

## 2013-03-17 DIAGNOSIS — F329 Major depressive disorder, single episode, unspecified: Secondary | ICD-10-CM

## 2013-03-17 DIAGNOSIS — F3289 Other specified depressive episodes: Secondary | ICD-10-CM

## 2013-03-17 DIAGNOSIS — R059 Cough, unspecified: Secondary | ICD-10-CM

## 2013-03-17 DIAGNOSIS — F0391 Unspecified dementia with behavioral disturbance: Secondary | ICD-10-CM

## 2013-03-17 DIAGNOSIS — M1710 Unilateral primary osteoarthritis, unspecified knee: Secondary | ICD-10-CM

## 2013-03-17 NOTE — Assessment & Plan Note (Signed)
Pt is pleasant one moment and not the next but in general behavoir is OK and she does well;she is on only 3 meds total at age 80! Will not be starting any for dementia

## 2013-03-17 NOTE — Progress Notes (Signed)
MRN: [id number] Name: Holly Rivera  Sex: female Age: 80 y.o. DOB: [birth year]  PSC #:  Facility/Room: Level Of Care: SNF Provider: Merrilee Seashore D Emergency Contacts: Extended Emergency Contact Information Primary Emergency Contact: Gagnon,Anita Address: [address]          [name] [address] [place] Home Phone: [phone] Relation: None  Code Status:   Allergies: Penicillins  Chief Complaint  Patient presents with  . Medical Managment of Chronic Issues    HPI: Patient is 80 y.o. female who   Past Medical History  Diagnosis Date  . Pneumonia   . Arthritis     Past Surgical History  Procedure Laterality Date  . Appendectomy        Medication List       This list is accurate as of: 03/17/13  8:10 PM.  Always use your most recent med list.               acetaminophen 325 MG tablet  Commonly known as:  TYLENOL  Take 2 tablets (650 mg total) by mouth every 6 (six) hours as needed.     nystatin powder  Commonly known as:  MYCOSTATIN  Apply topically 2 (two) times daily as needed. abd folds and thighs        No orders of the defined types were placed in this encounter.    Immunization History  Administered Date(s) Administered  . Influenza-Unspecified 11/25/2012    History  Substance Use Topics  . Smoking status: Never Smoker   . Smokeless tobacco: Not on file  . Alcohol Use: No    Review of Systems  DATA OBTAINED: from patient, nurse, medical record, family member GENERAL: Feels well no fevers, fatigue, appetite changes SKIN: No itching, rash HEENT: No complaint RESPIRATORY: No cough, wheezing, SOB CARDIAC: No chest pain, palpitations, lower extremity edema  GI: No abdominal pain, No N/V/D or constipation, No heartburn or reflux  GU: No dysuria, frequency or urgency, or incontinence  MUSCULOSKELETAL: No unrelieved bone/joint pain NEUROLOGIC: No headache, dizziness or focal weakness PSYCHIATRIC: No overt anxiety  or sadness. Sleeps well.   Filed Vitals:   03/17/13 1511  BP: 125/81  Pulse: 108  Temp: 97 F (36.1 C)  Resp: 22    Physical Exam  GENERAL APPEARANCE: Alert, conversant. Appropriately groomed. No acute distress  SKIN: No diaphoresis rash, or wounds HEENT: Unremarkable RESPIRATORY: Breathing is even, unlabored. Lung sounds are clear   CARDIOVASCULAR: Heart RRR no murmurs, rubs or gallops. No peripheral edema  GASTROINTESTINAL: Abdomen is soft, non-tender, not distended w/ normal bowel sounds.  GENITOURINARY: Bladder non tender, not distended  MUSCULOSKELETAL: No abnormal joints or musculature NEUROLOGIC: Cranial nerves 2-12 grossly intact. Moves all extremities no tremor. PSYCHIATRIC: Mood and affect appropriate to situation, no behavioral issues  Patient Active Problem List   Diagnosis Date Noted  . Dementia with behavioral disturbance 02/15/2013  . Depression 11/15/2012  . Psychosis 08/03/2012  . Osteoarthrosis, unspecified whether generalized or localized, lower leg 06/29/2012  . Memory loss 06/29/2012  . Unable to ambulate 04/09/2012  . Frequent falls 04/09/2012  . Abrasion 04/09/2012    CBC    Component Value Date/Time   WBC 9.5 04/12/2012 1032   RBC 4.29 04/12/2012 1032   HGB 13.2 04/12/2012 1032   HCT 38.5 04/12/2012 1032   PLT 210 04/12/2012 1032   MCV 89.7 04/12/2012 1032   LYMPHSABS 1.1 04/12/2012 1032   MONOABS 1.0 04/12/2012 1032   EOSABS 0.0 04/12/2012 1032  BASOSABS 0.0 04/12/2012 1032    CMP     Component Value Date/Time   NA 139 04/12/2012 1032   K 3.9 04/12/2012 1032   CL 104 04/12/2012 1032   CO2 26 04/12/2012 1032   GLUCOSE 130* 04/12/2012 1032   BUN 18 04/12/2012 1032   CREATININE 0.54 04/12/2012 1032   CALCIUM 8.8 04/12/2012 1032   GFRNONAA 80* 04/12/2012 1032   GFRAA >90 04/12/2012 1032    Assessment and Plan  Dementia with behavioral disturbance Pt is pleasant one moment and not the next but in general behavoir is OK and she does well;she is on  only 3 meds total at age 80! Will not be starting any for dementia  Osteoarthrosis, unspecified whether generalized or localized, lower leg Pt uses tylenol when needed;I have not her her c/o arthritis pain  Depression Pt is on no meds and does fine;i would not start anything  COUGH- pt has c/o for several weeks on and off;i have not heard her cough; she has a CXR earlier in the month that was neg and has taken robitussin on and off;O2 sat today 98%; will continue observation  Margit HanksALEXANDER, Kaiah Hosea D, MD

## 2013-03-17 NOTE — Assessment & Plan Note (Signed)
Pt uses tylenol when needed;I have not her her c/o arthritis pain

## 2013-03-17 NOTE — Assessment & Plan Note (Signed)
Pt is on no meds and does fine;i would not start anything

## 2013-04-14 ENCOUNTER — Non-Acute Institutional Stay (SKILLED_NURSING_FACILITY): Payer: PRIVATE HEALTH INSURANCE | Admitting: Internal Medicine

## 2013-04-14 ENCOUNTER — Encounter: Payer: Self-pay | Admitting: Internal Medicine

## 2013-04-14 DIAGNOSIS — M1710 Unilateral primary osteoarthritis, unspecified knee: Secondary | ICD-10-CM

## 2013-04-14 DIAGNOSIS — F03918 Unspecified dementia, unspecified severity, with other behavioral disturbance: Secondary | ICD-10-CM

## 2013-04-14 DIAGNOSIS — F32A Depression, unspecified: Secondary | ICD-10-CM

## 2013-04-14 DIAGNOSIS — F329 Major depressive disorder, single episode, unspecified: Secondary | ICD-10-CM

## 2013-04-14 DIAGNOSIS — IMO0002 Reserved for concepts with insufficient information to code with codable children: Secondary | ICD-10-CM

## 2013-04-14 DIAGNOSIS — I739 Peripheral vascular disease, unspecified: Secondary | ICD-10-CM

## 2013-04-14 DIAGNOSIS — N182 Chronic kidney disease, stage 2 (mild): Secondary | ICD-10-CM | POA: Insufficient documentation

## 2013-04-14 DIAGNOSIS — F3289 Other specified depressive episodes: Secondary | ICD-10-CM

## 2013-04-14 DIAGNOSIS — F0391 Unspecified dementia with behavioral disturbance: Secondary | ICD-10-CM

## 2013-04-14 NOTE — Assessment & Plan Note (Signed)
On no meds;occ combativeness but will hold off meds for now

## 2013-04-14 NOTE — Assessment & Plan Note (Signed)
Relatively stable on no meds so will not start any new

## 2013-04-14 NOTE — Assessment & Plan Note (Signed)
No wounds;continue to monitor

## 2013-04-14 NOTE — Assessment & Plan Note (Signed)
Wheelchair bound but able to ambulate fine with it;uses only tylenol

## 2013-04-14 NOTE — Progress Notes (Signed)
MRN: [id number] Name: Holly Rivera  Sex: female Age: 80 y.o. DOB: 1918/08/28  [facility] Facility/Room: 103A Level Of Care: SNF Provider: Merrilee SeashoreALEXANDER, Briseida Gittings D Emergency Contacts: Extended Emergency Contact Information Primary Emergency Contact: Gagnon,Anita Address[address]          [place] [id number] [place] Home Phone: [phone] Relation: None   Allergies: Penicillins  Chief Complaint  Patient presents with  . Medical Managment of Chronic Issues    HPI: Patient is 80 y.o. female who is being seen for routine problems.  Past Medical History  Diagnosis Date  . Pneumonia   . Arthritis   . Depression     Past Surgical History  Procedure Laterality Date  . Appendectomy        Medication List       This list is accurate as of: 04/14/13 11:38 PM.  Always use your most recent med list.               acetaminophen 325 MG tablet  Commonly known as:  TYLENOL  Take 2 tablets (650 mg total) by mouth every 6 (six) hours as needed.     nystatin powder  Commonly known as:  MYCOSTATIN  Apply topically 2 (two) times daily as needed. abd folds and thighs        No orders of the defined types were placed in this encounter.    Immunization History  Administered Date(s) Administered  . Influenza-Unspecified 11/25/2012    History  Substance Use Topics  . Smoking status: Never Smoker   . Smokeless tobacco: Not on file  . Alcohol Use: No    Review of Systems  DATA OBTAINED: from patient, nurse, medical record GENERAL: Feels well no fevers, fatigue, appetite changes SKIN: No itching, rash HEENT: No complaint RESPIRATORY: No cough, wheezing, SOB CARDIAC: No chest pain, palpitations, lower extremity edema  GI: No abdominal pain, No N/V/D or constipation, No heartburn or reflux  GU: No dysuria, frequency or urgency, or incontinence  MUSCULOSKELETAL: No unrelieved bone/joint pain NEUROLOGIC: No headache, dizziness or focal  weakness PSYCHIATRIC: No overt anxiety or sadness. Sleeps well.   Filed Vitals:   04/14/13 2324  BP: 125/81  Pulse: 108  Temp: 97 F (36.1 C)  Resp: 22    Physical Exam  GENERAL APPEARANCE: Alert, conversant. Appropriately groomed. No acute distress  SKIN: No diaphoresis rash, or wounds HEENT: Unremarkable RESPIRATORY: Breathing is even, unlabored. Lung sounds are clear   CARDIOVASCULAR: Heart RRR no murmurs, rubs or gallops. No peripheral edema  GASTROINTESTINAL: Abdomen is soft, non-tender, not distended w/ normal bowel sounds.  GENITOURINARY: Bladder non tender, not distended  MUSCULOSKELETAL: No abnormal joints or musculature NEUROLOGIC: Cranial nerves 2-12 grossly intact. Moves all extremities no tremor. PSYCHIATRIC:pleasant today, no behavioral issues  Patient Active Problem List   Diagnosis Date Noted  . PVD (peripheral vascular disease) 04/14/2013  . CKD (chronic kidney disease) stage 2, GFR 60-89 ml/min 04/14/2013  . Dementia with behavioral disturbance 02/15/2013  . Depression 11/15/2012  . Psychosis 08/03/2012  . Osteoarthrosis, unspecified whether generalized or localized, lower leg 06/29/2012  . Memory loss 06/29/2012  . Unable to ambulate 04/09/2012  . Frequent falls 04/09/2012  . Abrasion 04/09/2012    CBC    Component Value Date/Time   WBC 9.5 04/12/2012 1032   RBC 4.29 04/12/2012 1032   HGB 13.2 04/12/2012 1032   HCT 38.5 04/12/2012 1032   PLT 210 04/12/2012 1032   MCV 89.7 04/12/2012 1032  LYMPHSABS 1.1 04/12/2012 1032   MONOABS 1.0 04/12/2012 1032   EOSABS 0.0 04/12/2012 1032   BASOSABS 0.0 04/12/2012 1032    CMP     Component Value Date/Time   NA 139 04/12/2012 1032   K 3.9 04/12/2012 1032   CL 104 04/12/2012 1032   CO2 26 04/12/2012 1032   GLUCOSE 130* 04/12/2012 1032   BUN 18 04/12/2012 1032   CREATININE 0.54 04/12/2012 1032   CALCIUM 8.8 04/12/2012 1032   GFRNONAA 80* 04/12/2012 1032   GFRAA >90 04/12/2012 1032    Assessment and  Plan  Depression On no meds;occ combativeness but will hold off meds for now  Osteoarthrosis, unspecified whether generalized or localized, lower leg Wheelchair bound but able to ambulate fine with it;uses only tylenol  PVD (peripheral vascular disease) No wounds;continue to monitor  Dementia with behavioral disturbance Relatively stable on no meds so will not start any new  CKD (chronic kidney disease) stage 2, GFR 60-89 ml/min GFR 88, CrCl 65; secondary to aging; monitor    Margit Hanks, MD

## 2013-04-14 NOTE — Assessment & Plan Note (Signed)
GFR 88, CrCl 65; secondary to aging; monitor

## 2013-04-19 ENCOUNTER — Non-Acute Institutional Stay (SKILLED_NURSING_FACILITY): Payer: Medicare Other | Admitting: Internal Medicine

## 2013-04-19 ENCOUNTER — Encounter: Payer: Self-pay | Admitting: Internal Medicine

## 2013-04-19 DIAGNOSIS — R1031 Right lower quadrant pain: Secondary | ICD-10-CM

## 2013-04-19 NOTE — Progress Notes (Signed)
MRN: [id number] Name: Holly Rivera  Sex: female Age: 80 y.o. DOB: [birth year]  [facility] Facility/Room:  103A Level Of Care: SNF Provider: Merrilee Seashore D Emergency Contacts: Extended Emergency Contact Information Primary Emergency Contact: Gagnon,Anita Address: [address]          [name] [address] [place] Home Phone: [phone] Relation: None  Code Status:   Allergies: Penicillins  Chief Complaint  Patient presents with  . Acute Visit    HPI: Patient is 80 y.o. female who the nurses asked me to see because pt has been c/o R side pain which she has c/o to me before. The nurse thinks its abd, I think in the past it has been groin. Pt has no fever, no change in bowels or bladder, no fever, no change in MS or activity.  Past Medical History  Diagnosis Date  . Pneumonia   . Arthritis   . Depression     Past Surgical History  Procedure Laterality Date  . Appendectomy        Medication List       This list is accurate as of: 04/19/13  9:26 PM.  Always use your most recent med list.               acetaminophen 325 MG tablet  Commonly known as:  TYLENOL  Take 2 tablets (650 mg total) by mouth every 6 (six) hours as needed.     nystatin powder  Commonly known as:  MYCOSTATIN  Apply topically 2 (two) times daily as needed. abd folds and thighs        No orders of the defined types were placed in this encounter.    Immunization History  Administered Date(s) Administered  . Influenza-Unspecified 11/25/2012    History  Substance Use Topics  . Smoking status: Never Smoker   . Smokeless tobacco: Not on file  . Alcohol Use: No    Review of Systems  DATA OBTAINED: from patient;AS PER HPI GENERAL: Feels well no fevers, fatigue, appetite changes SKIN: No itching, rash HEENT: No complaint RESPIRATORY: No cough, wheezing, SOB CARDIAC: No chest pain, palpitations, lower extremity edema  GI: No abdominal pain, No N/V/D or  constipation, No heartburn or reflux  GU: No dysuria, frequency or urgency, or incontinence  MUSCULOSKELETAL: No unrelieved bone/joint pain NEUROLOGIC: No headache, dizziness or focal weakness PSYCHIATRIC: No overt anxiety or sadness. Sleeps well.   Filed Vitals:   04/19/13 2123  BP: 125/81  Pulse: 108  Temp: 97 F (36.1 C)  Resp: 22    Physical Exam  GENERAL APPEARANCE: Alert, conversant. Appropriately groomed. No acute distress  SKIN: No diaphoresis rash HEENT: Unremarkable RESPIRATORY: Breathing is even, unlabored. Lung sounds are clear   CARDIOVASCULAR: Heart RRR no murmurs, rubs or gallops. No peripheral edema  GASTROINTESTINAL: Abdomen is soft, non-tender, not distended w/ normal bowel sounds. Pt put her finger then my finger right at source of pain which was over bone in R sacral area;it did not appear to be very TTP; soft area of abdomen in area was not TTP GENITOURINARY: Bladder non tender, not distended  MUSCULOSKELETAL: No abnormal joints or musculature NEUROLOGIC: Cranial nerves 2-12 grossly intact. Moves all extremities no tremor. PSYCHIATRIC: Mood and affect appropriate to situation, no behavioral issues  Patient Active Problem List   Diagnosis Date Noted  . PVD (peripheral vascular disease) 04/14/2013  . CKD (chronic kidney disease) stage 2, GFR 60-89 ml/min 04/14/2013  . Dementia with behavioral disturbance  02/15/2013  . Depression 11/15/2012  . Psychosis 08/03/2012  . Osteoarthrosis, unspecified whether generalized or localized, lower leg 06/29/2012  . Memory loss 06/29/2012  . Unable to ambulate 04/09/2012  . Frequent falls 04/09/2012  . Abrasion 04/09/2012    CBC    Component Value Date/Time   WBC 9.5 04/12/2012 1032   RBC 4.29 04/12/2012 1032   HGB 13.2 04/12/2012 1032   HCT 38.5 04/12/2012 1032   PLT 210 04/12/2012 1032   MCV 89.7 04/12/2012 1032   LYMPHSABS 1.1 04/12/2012 1032   MONOABS 1.0 04/12/2012 1032   EOSABS 0.0 04/12/2012 1032   BASOSABS 0.0  04/12/2012 1032    CMP     Component Value Date/Time   NA 139 04/12/2012 1032   K 3.9 04/12/2012 1032   CL 104 04/12/2012 1032   CO2 26 04/12/2012 1032   GLUCOSE 130* 04/12/2012 1032   BUN 18 04/12/2012 1032   CREATININE 0.54 04/12/2012 1032   CALCIUM 8.8 04/12/2012 1032   GFRNONAA 80* 04/12/2012 1032   GFRAA >90 04/12/2012 1032    Assessment and Plan  R ANTERIOR HIP/GROIN PAIN - MINOR,ARTHRITIC vs soft tissue -tylenol for pain  Margit HanksALEXANDER, Everlene Cunning D, MD

## 2013-06-21 ENCOUNTER — Encounter: Payer: Self-pay | Admitting: Internal Medicine

## 2013-06-21 ENCOUNTER — Non-Acute Institutional Stay (SKILLED_NURSING_FACILITY): Payer: PRIVATE HEALTH INSURANCE | Admitting: Internal Medicine

## 2013-06-21 DIAGNOSIS — I739 Peripheral vascular disease, unspecified: Secondary | ICD-10-CM

## 2013-06-21 DIAGNOSIS — N182 Chronic kidney disease, stage 2 (mild): Secondary | ICD-10-CM

## 2013-06-21 DIAGNOSIS — F329 Major depressive disorder, single episode, unspecified: Secondary | ICD-10-CM

## 2013-06-21 DIAGNOSIS — M1710 Unilateral primary osteoarthritis, unspecified knee: Secondary | ICD-10-CM

## 2013-06-21 DIAGNOSIS — F0391 Unspecified dementia with behavioral disturbance: Secondary | ICD-10-CM

## 2013-06-21 DIAGNOSIS — F32A Depression, unspecified: Secondary | ICD-10-CM

## 2013-06-21 DIAGNOSIS — IMO0002 Reserved for concepts with insufficient information to code with codable children: Secondary | ICD-10-CM

## 2013-06-21 DIAGNOSIS — E785 Hyperlipidemia, unspecified: Secondary | ICD-10-CM | POA: Insufficient documentation

## 2013-06-21 DIAGNOSIS — F03918 Unspecified dementia, unspecified severity, with other behavioral disturbance: Secondary | ICD-10-CM

## 2013-06-21 DIAGNOSIS — F3289 Other specified depressive episodes: Secondary | ICD-10-CM

## 2013-06-21 NOTE — Assessment & Plan Note (Addendum)
LDL 149 and chol 232; diet discussed and fish oil started BID

## 2013-06-21 NOTE — Assessment & Plan Note (Addendum)
Pt does well with some independent activities but behavoir waxes and wanes;sometimes she hits and screams but you usually be calmed or at least separated from others and calms down; started on depakote daily;can be increased to BID if necessary

## 2013-06-21 NOTE — Assessment & Plan Note (Signed)
Stable,continues without wounds

## 2013-06-21 NOTE — Progress Notes (Signed)
MRN: [id number] Name: Holly Rivera  Sex: female Age: 80 y.o. DOB: 1918-11-06  [facility]: [facility]A Level Of Care: SNF Provider: Margit HanksAnne D Yolande Skoda Emergency Contacts: Extended Emergency Contact Information Primary Emergency Contact: Gagnon,Anita [address]: [address]          [place] [id number] [place] Home Phone: [phone] Relation: None  Code Status: FULL  Allergies: Penicillins  Chief Complaint  Patient presents with  . Medical Management of Chronic Issues    HPI: Patient is 80 y.o. female who is being seen for routine issues.   Past Medical History  Diagnosis Date  . Pneumonia   . Arthritis   . Depression     Past Surgical History  Procedure Laterality Date  . Appendectomy        Medication List       This list is accurate as of: 06/21/13  7:47 PM.  Always use your most recent med list.               acetaminophen 325 MG tablet  Commonly known as:  TYLENOL  Take 2 tablets (650 mg total) by mouth every 6 (six) hours as needed.     divalproex 125 MG capsule  Commonly known as:  DEPAKOTE SPRINKLE  Take 125 mg by mouth daily.     Fish Oil 1000 MG Cpdr  Take 1,000 mg by mouth 2 (two) times daily.     nystatin powder  Commonly known as:  MYCOSTATIN  Apply topically 2 (two) times daily as needed. abd folds and thighs        Meds ordered this encounter  Medications  . Omega-3 Fatty Acids (FISH OIL) 1000 MG CPDR    Sig: Take 1,000 mg by mouth 2 (two) times daily.  . divalproex (DEPAKOTE SPRINKLE) 125 MG capsule    Sig: Take 125 mg by mouth daily.    Immunization History  Administered Date(s) Administered  . Influenza-Unspecified 11/25/2012    History  Substance Use Topics  . Smoking status: Never Smoker   . Smokeless tobacco: Not on file  . Alcohol Use: No    Review of Systems  DATA OBTAINED: from patient, nurse GENERAL: Feels well no fevers, fatigue, appetite changes SKIN: No itching,  rash HEENT: No complaint RESPIRATORY: No cough, wheezing, SOB CARDIAC: No chest pain, palpitations, lower extremity edema  GI: No abdominal pain, No N/V/D or constipation, No heartburn or reflux  GU: No dysuria, frequency or urgency, or incontinence  MUSCULOSKELETAL: No unrelieved bone/joint pain NEUROLOGIC: No headache, dizziness or focal weakness PSYCHIATRIC: No overt anxiety or sadness. Sleeps well.   Filed Vitals:   06/21/13 1924  BP: 125/74  Pulse: 72  Temp: 97.1 F (36.2 C)  Resp: 19    Physical Exam  GENERAL APPEARANCE: Alert, conversant. Appropriately groomed. No acute distress  SKIN: No diaphoresis rash HEENT: Unremarkable RESPIRATORY: Breathing is even, unlabored. Lung sounds are clear   CARDIOVASCULAR: Heart RRR no murmurs, rubs or gallops. No peripheral edema  GASTROINTESTINAL: Abdomen is soft, non-tender, not distended w/ normal bowel sounds.  GENITOURINARY: Bladder non tender, not distended  MUSCULOSKELETAL: No abnormal joints or musculature NEUROLOGIC: Cranial nerves 2-12 grossly intact. Moves all extremities no tremor. PSYCHIATRIC: Mood and affect appropriate to situation, no behavioral issues  Patient Active Problem List   Diagnosis Date Noted  . Other and unspecified hyperlipidemia 06/21/2013  . PVD (peripheral vascular disease) 04/14/2013  . CKD (chronic kidney disease) stage 2, GFR 60-89 ml/min 04/14/2013  .  Dementia with behavioral disturbance 02/15/2013  . Depression 11/15/2012  . Psychosis 08/03/2012  . Osteoarthrosis, unspecified whether generalized or localized, lower leg 06/29/2012  . Memory loss 06/29/2012  . Unable to ambulate 04/09/2012  . Frequent falls 04/09/2012  . Abrasion 04/09/2012       Assessment and Plan  Dementia with behavioral disturbance Pt does well with some independent activities but behavoir waxes and wanes;sometimes she hits and screams but you usually be calmed or at least separated from others and calms down; started  on depakote daily;can be increased to BID if necessary  PVD (peripheral vascular disease) Stable,continues without wounds  Depression No current antidepressive tx; started on depakote for behavoirs  Osteoarthrosis, unspecified whether generalized or localized, lower leg Vit D 50,000 units weekly;still doing well ambulating in WC  Other and unspecified hyperlipidemia LDL 149 and chol 232; diet discussed and fish oil started BID  CKD (chronic kidney disease) stage 2, GFR 60-89 ml/min GFR 88    Margit HanksAnne D Koda Defrank, MD

## 2013-06-21 NOTE — Assessment & Plan Note (Signed)
Vit D 50,000 units weekly;[facility]

## 2013-06-21 NOTE — Assessment & Plan Note (Signed)
GFR 88

## 2013-06-21 NOTE — Assessment & Plan Note (Signed)
No current antidepressive tx; started on depakote for behavoirs

## 2013-12-02 ENCOUNTER — Encounter: Payer: Self-pay | Admitting: Internal Medicine

## 2013-12-02 ENCOUNTER — Non-Acute Institutional Stay (SKILLED_NURSING_FACILITY): Payer: PRIVATE HEALTH INSURANCE | Admitting: Internal Medicine

## 2013-12-02 DIAGNOSIS — N182 Chronic kidney disease, stage 2 (mild): Secondary | ICD-10-CM

## 2013-12-02 DIAGNOSIS — E785 Hyperlipidemia, unspecified: Secondary | ICD-10-CM

## 2013-12-02 DIAGNOSIS — F0391 Unspecified dementia with behavioral disturbance: Secondary | ICD-10-CM

## 2013-12-02 DIAGNOSIS — I739 Peripheral vascular disease, unspecified: Secondary | ICD-10-CM

## 2013-12-02 DIAGNOSIS — F03918 Unspecified dementia, unspecified severity, with other behavioral disturbance: Secondary | ICD-10-CM

## 2013-12-02 NOTE — Assessment & Plan Note (Signed)
Currently on depakote 250 Mg TID just increased form BID; followed by [facility], thank you, will continue to monitor behavoirs

## 2013-12-02 NOTE — Assessment & Plan Note (Signed)
Depakote has been inc to 125 mg TID and will monitor for behavoirs

## 2013-12-02 NOTE — Assessment & Plan Note (Signed)
11/09/2013 - LDL141, HDL 43, TG 71; not far from goal which is good because pt refuses statin

## 2013-12-02 NOTE — Assessment & Plan Note (Signed)
Improved with GFR 110, CrCl calc 76

## 2013-12-02 NOTE — Assessment & Plan Note (Signed)
Chronic and stable with monitoring for wounds but none currently

## 2013-12-02 NOTE — Progress Notes (Signed)
MRN: [id number] Name: CIANA SIMMON  Sex: female Age: 80 y.o. DOB: [birth year]  PSC #:  Facility/Room: Level Of Care: SNF Provider: Merrilee Seashore D Emergency Contacts: Extended Emergency Contact Information Primary Emergency Contact: Gagnon,Anita Address: [address]          [name] [address] [place] Home Phone: [phone] Relation: None  Code Status:   Allergies: Penicillins  Chief Complaint  Patient presents with  . Medical Management of Chronic Issues    HPI: Patient is 80 y.o. female who   Past Medical History  Diagnosis Date  . Pneumonia   . Arthritis   . Depression     Past Surgical History  Procedure Laterality Date  . Appendectomy        Medication List       This list is accurate as of: 12/02/13  8:09 PM.  Always use your most recent med list.               acetaminophen 325 MG tablet  Commonly known as:  TYLENOL  Take 2 tablets (650 mg total) by mouth every 6 (six) hours as needed.     divalproex 125 MG capsule  Commonly known as:  DEPAKOTE SPRINKLE  Take 125 mg by mouth daily.     Fish Oil 1000 MG Cpdr  Take 1,000 mg by mouth 2 (two) times daily.     nystatin powder  Commonly known as:  MYCOSTATIN  Apply topically 2 (two) times daily as needed. abd folds and thighs        No orders of the defined types were placed in this encounter.    Immunization History  Administered Date(s) Administered  . Influenza-Unspecified 11/25/2012    History  Substance Use Topics  . Smoking status: Never Smoker   . Smokeless tobacco: Not on file  . Alcohol Use: No    Review of Systems  DATA OBTAINED: from patient GENERAL:  no fevers, fatigue, appetite changes SKIN: No itching, rash HEENT: No complaint RESPIRATORY: No cough, wheezing, SOB CARDIAC: No chest pain, palpitations, lower extremity edema  GI: No abdominal pain, No N/V/D or constipation, No heartburn or reflux  GU: No dysuria, frequency or urgency, or  incontinence  MUSCULOSKELETAL: No unrelieved bone/joint pain NEUROLOGIC: No headache, dizziness  PSYCHIATRIC: No overt anxiety or sadness  Filed Vitals:   12/02/13 1953  BP: 122/78  Pulse: 74  Temp: 97 F (36.1 C)  Resp: 20    Physical Exam  GENERAL APPEARANCE: Alert, conversant, No acute distress  SKIN: No diaphoresis rash, or wounds HEENT: Unremarkable RESPIRATORY: Breathing is even, unlabored. Lung sounds are clear   CARDIOVASCULAR: Heart RRR no murmurs, rubs or gallops. No peripheral edema  GASTROINTESTINAL: Abdomen is soft, non-tender, not distended w/ normal bowel sounds.  GENITOURINARY: Bladder non tender, not distended  MUSCULOSKELETAL: No abnormal joints or musculature NEUROLOGIC: Cranial nerves 2-12 grossly intact. Moves all extremities PSYCHIATRIC: Mood and affect appropriate to situation, no behavioral issues  Patient Active Problem List   Diagnosis Date Noted  . Hyperlipidemia LDL goal <130 06/21/2013  . PVD (peripheral vascular disease) 04/14/2013  . CKD (chronic kidney disease) stage 2, GFR 60-89 ml/min 04/14/2013  . Dementia with behavioral disturbance 02/15/2013  . Depression 11/15/2012  . Depressive psychosis 08/03/2012  . Osteoarthrosis, unspecified whether generalized or localized, lower leg 06/29/2012  . Memory loss 06/29/2012  . Unable to ambulate 04/09/2012  . Frequent falls 04/09/2012  . Abrasion 04/09/2012    CBC  Component Value Date/Time   WBC 9.5 04/12/2012 1032   RBC 4.29 04/12/2012 1032   HGB 13.2 04/12/2012 1032   HCT 38.5 04/12/2012 1032   PLT 210 04/12/2012 1032   MCV 89.7 04/12/2012 1032   LYMPHSABS 1.1 04/12/2012 1032   MONOABS 1.0 04/12/2012 1032   EOSABS 0.0 04/12/2012 1032   BASOSABS 0.0 04/12/2012 1032    CMP     Component Value Date/Time   NA 139 04/12/2012 1032   K 3.9 04/12/2012 1032   CL 104 04/12/2012 1032   CO2 26 04/12/2012 1032   GLUCOSE 130* 04/12/2012 1032   BUN 18 04/12/2012 1032   CREATININE 0.54 04/12/2012 1032    CALCIUM 8.8 04/12/2012 1032   GFRNONAA 80* 04/12/2012 1032   GFRAA >90 04/12/2012 1032    Assessment and Plan  Depressive psychosis Currently on depakote 250 Mg TID just increased form BID; followed by [facility], thank you, will continue to monitor behavoirs  Hyperlipidemia LDL goal <130 11/09/2013 - [id number], HDL 43, TG 71; not far from goal which is good because pt refuses statin  CKD (chronic kidney disease) stage 2, GFR 60-89 ml/min Improved with GFR 110, CrCl calc 76  Dementia with behavioral disturbance Depakote has been inc to 125 mg TID and will monitor for behavoirs  PVD (peripheral vascular disease) Chronic and stable with monitoring for wounds but none currently   Margit HanksALEXANDER, ANNE D, MD

## 2014-03-03 ENCOUNTER — Non-Acute Institutional Stay (SKILLED_NURSING_FACILITY): Payer: Medicare Other | Admitting: Internal Medicine

## 2014-03-03 DIAGNOSIS — E785 Hyperlipidemia, unspecified: Secondary | ICD-10-CM

## 2014-03-03 DIAGNOSIS — M1710 Unilateral primary osteoarthritis, unspecified knee: Secondary | ICD-10-CM

## 2014-03-03 DIAGNOSIS — N182 Chronic kidney disease, stage 2 (mild): Secondary | ICD-10-CM

## 2014-03-03 DIAGNOSIS — F0391 Unspecified dementia with behavioral disturbance: Secondary | ICD-10-CM

## 2014-03-03 DIAGNOSIS — M179 Osteoarthritis of knee, unspecified: Secondary | ICD-10-CM

## 2014-03-03 DIAGNOSIS — I739 Peripheral vascular disease, unspecified: Secondary | ICD-10-CM

## 2014-03-03 DIAGNOSIS — F03918 Unspecified dementia, unspecified severity, with other behavioral disturbance: Secondary | ICD-10-CM

## 2014-03-03 DIAGNOSIS — IMO0002 Reserved for concepts with insufficient information to code with codable children: Secondary | ICD-10-CM

## 2014-03-03 NOTE — Progress Notes (Signed)
MRN: [id number] Name: Holly Rivera  Sex: female Age: 81 y.o. DOB: 1918-09-08  [facility]A Level Of Care: SNF Provider: Merrilee SeashoreALEXANDER, Dejean Tribby D Emergency Contacts: Extended Emergency Contact Information Primary Emergency Contact: Gagnon,Anita Address: [address]          [place] [id number] [place] Home Phone: [phone] Relation: None  Code Status:FULL   Allergies: Penicillins  Chief Complaint  Patient presents with  . Medical Management of Chronic Issues    HPI: Patient is 81 y.o. female who is being seen for routine issues.  Past Medical History  Diagnosis Date  . Pneumonia   . Arthritis   . Depression     Past Surgical History  Procedure Laterality Date  . Appendectomy        Medication List       This list is accurate as of: 03/03/14 11:59 PM.  Always use your most recent med list.               acetaminophen 325 MG tablet  Commonly known as:  TYLENOL  Take 2 tablets (650 mg total) by mouth every 6 (six) hours as needed.     divalproex 125 MG capsule  Commonly known as:  DEPAKOTE SPRINKLE  Take 125 mg by mouth daily.     Fish Oil 1000 MG Cpdr  Take 1,000 mg by mouth 2 (two) times daily.     nystatin powder  Commonly known as:  MYCOSTATIN  Apply topically 2 (two) times daily as needed. abd folds and thighs        No orders of the defined types were placed in this encounter.    Immunization History  Administered Date(s) Administered  . Influenza-Unspecified 11/25/2012    History  Substance Use Topics  . Smoking status: Never Smoker   . Smokeless tobacco: Not on file  . Alcohol Use: No    Review of Systems  DATA OBTAINED: from patient; no c/o GENERAL:  no fevers, fatigue, appetite changes SKIN: No itching, rash HEENT: No complaint RESPIRATORY: No cough, wheezing, SOB CARDIAC: No chest pain, palpitations, lower extremity edema  GI: No abdominal pain, No N/V/D or constipation, No  heartburn or reflux  GU: No dysuria, frequency or urgency, or incontinence  MUSCULOSKELETAL: No unrelieved bone/joint pain NEUROLOGIC: No headache, dizziness  PSYCHIATRIC: No overt anxiety or sadness  Filed Vitals:   03/03/14 2025  BP: 121/71  Pulse: 79  Temp: 97.1 F (36.2 C)  Resp: 19    Physical Exam  GENERAL APPEARANCE: Alert, conversant, No acute distress,very pleasant today  SKIN: No diaphoresis rash, or wounds HEENT: Unremarkable RESPIRATORY: Breathing is even, unlabored. Lung sounds are clear   CARDIOVASCULAR: Heart RRR no murmurs, rubs or gallops. No peripheral edema  GASTROINTESTINAL: Abdomen is soft, non-tender, not distended w/ normal bowel sounds.  GENITOURINARY: Bladder non tender, not distended  MUSCULOSKELETAL: No abnormal joints or musculature NEUROLOGIC: Cranial nerves 2-12 grossly intact. Moves all extremities PSYCHIATRIC: dementia, no behavioral issues at this moment  Patient Active Problem List   Diagnosis Date Noted  . Hyperlipidemia LDL goal <130 06/21/2013  . PVD (peripheral vascular disease) 04/14/2013  . CKD (chronic kidney disease) stage 2, GFR 60-89 ml/min 04/14/2013  . Dementia with behavioral disturbance 02/15/2013  . Depression 11/15/2012  . Depressive psychosis 08/03/2012  . Osteoarthrosis, unspecified whether generalized or localized, involving lower leg 06/29/2012  . Memory loss 06/29/2012  . Unable to ambulate 04/09/2012  . Frequent falls 04/09/2012  . Abrasion 04/09/2012  Assessment and Plan  Dementia with behavioral disturbance Depakote has been inc to 250 BID and now to TID for co ntinued behavoirs although when I saw her she was very pleasnat but she has her days. Will monitor with psych.  CKD (chronic kidney disease) stage 2, GFR 60-89 ml/min Has improved to stage 1 with most recent GFR 110 and CrCl 76  PVD (peripheral vascular disease) Chronic and stable with no new wounds;will monitor  Osteoarthrosis, unspecified  whether generalized or localized, involving lower leg Uses WC, no recent flls, pain control with tylenol only  Hyperlipidemia LDL goal <130 Pt declines meds other than Fish oil 1000 mg BID which she is on    Margit Hanks, MD

## 2014-03-06 ENCOUNTER — Encounter: Payer: Self-pay | Admitting: Internal Medicine

## 2014-03-06 NOTE — Assessment & Plan Note (Signed)
Uses WC, no recent flls, pain control with tylenol only

## 2014-03-06 NOTE — Assessment & Plan Note (Signed)
Pt declines meds other than Fish oil 1000 mg BID which she is on

## 2014-03-06 NOTE — Assessment & Plan Note (Signed)
Depakote has been inc to 250 BID and now to TID for co ntinued behavoirs although when I saw her she was very pleasnat but she has her days. Will monitor with psych.

## 2014-03-06 NOTE — Assessment & Plan Note (Signed)
Chronic and stable with no new wounds;will monitor

## 2014-03-06 NOTE — Assessment & Plan Note (Signed)
Has improved to stage 1 with most recent GFR 110 and CrCl 76

## 2014-04-28 ENCOUNTER — Non-Acute Institutional Stay (SKILLED_NURSING_FACILITY): Payer: Medicare Other | Admitting: Internal Medicine

## 2014-04-28 DIAGNOSIS — F0391 Unspecified dementia with behavioral disturbance: Secondary | ICD-10-CM | POA: Diagnosis not present

## 2014-04-28 DIAGNOSIS — I739 Peripheral vascular disease, unspecified: Secondary | ICD-10-CM | POA: Diagnosis not present

## 2014-04-28 DIAGNOSIS — E785 Hyperlipidemia, unspecified: Secondary | ICD-10-CM | POA: Diagnosis not present

## 2014-04-28 DIAGNOSIS — F03918 Unspecified dementia, unspecified severity, with other behavioral disturbance: Secondary | ICD-10-CM

## 2014-04-28 DIAGNOSIS — N182 Chronic kidney disease, stage 2 (mild): Secondary | ICD-10-CM

## 2014-04-28 DIAGNOSIS — M1710 Unilateral primary osteoarthritis, unspecified knee: Secondary | ICD-10-CM

## 2014-04-28 DIAGNOSIS — M179 Osteoarthritis of knee, unspecified: Secondary | ICD-10-CM

## 2014-04-28 DIAGNOSIS — IMO0002 Reserved for concepts with insufficient information to code with codable children: Secondary | ICD-10-CM

## 2014-04-28 NOTE — Progress Notes (Signed)
MRN: [id number] Name: Holly Rivera  Sex: female Age: 81 y.o. DOB: 09-26-1918  [facility] #: [facility] Facility/Room: Level Of Care: SNF Provider: Merrilee Seashore D Emergency Contacts: Extended Emergency Contact Information Primary Emergency Contact: Gagnon,Anita Address: [address]          [name] [address] [place] Home Phone: [phone] Relation: None  Code Status: FULL  Allergies: Penicillins  Chief Complaint  Patient presents with  . Medical Management of Chronic Issues    HPI: Patient is 81 y.o. female who is being seen for routine issues.  Past Medical History  Diagnosis Date  . Pneumonia   . Arthritis   . Depression     Past Surgical History  Procedure Laterality Date  . Appendectomy        Medication List       This list is accurate as of: 04/28/14 11:59 PM.  Always use your most recent med list.               acetaminophen 325 MG tablet  Commonly known as:  TYLENOL  Take 2 tablets (650 mg total) by mouth every 6 (six) hours as needed.     divalproex 125 MG capsule  Commonly known as:  DEPAKOTE SPRINKLE  Take 125 mg by mouth 3 (three) times daily.     Fish Oil 1000 MG Cpdr  Take 1,000 mg by mouth 2 (two) times daily.     nystatin powder  Commonly known as:  MYCOSTATIN  Apply topically 2 (two) times daily as needed. abd folds and thighs        No orders of the defined types were placed in this encounter.    Immunization History  Administered Date(s) Administered  . Influenza-Unspecified 11/25/2012    History  Substance Use Topics  . Smoking status: Never Smoker   . Smokeless tobacco: Not on file  . Alcohol Use: No    Review of Systems  DATA OBTAINED: from patient; no c/o-can i help her with her shoes? GENERAL:  no fevers, fatigue, appetite changes SKIN: No itching, rash HEENT: No complaint RESPIRATORY: No cough, wheezing, SOB CARDIAC: No chest pain, palpitations, lower extremity edema  GI: No abdominal  pain, No N/V/D or constipation, No heartburn or reflux  GU: No dysuria, frequency or urgency, or incontinence  MUSCULOSKELETAL: No unrelieved bone/joint pain NEUROLOGIC: No headache, dizziness  PSYCHIATRIC: No overt anxiety or sadness  Filed Vitals:   04/28/14 2148  BP: 126/77  Pulse: 70  Temp: 97 F (36.1 C)  Resp: 18    Physical Exam  GENERAL APPEARANCE: Alert, conversant, No acute distress  SKIN: No diaphoresis rash HEENT: Unremarkable RESPIRATORY: Breathing is even, unlabored. Lung sounds are clear   CARDIOVASCULAR: Heart RRR no murmurs, rubs or gallops. No peripheral edema  GASTROINTESTINAL: Abdomen is soft, non-tender, not distended w/ normal bowel sounds.  GENITOURINARY: Bladder non tender, not distended  MUSCULOSKELETAL: No abnormal joints or musculature except OA NEUROLOGIC: Cranial nerves 2-12 grossly intact. Moves all extremities PSYCHIATRIC: Mood and affect appropriate to situation,very pleasant and happy today  Patient Active Problem List   Diagnosis Date Noted  . Hyperlipidemia LDL goal <130 06/21/2013  . PVD (peripheral vascular disease) 04/14/2013  . CKD (chronic kidney disease) stage 2, GFR 60-89 ml/min 04/14/2013  . Dementia with behavioral disturbance 02/15/2013  . Depression 11/15/2012  . Depressive psychosis 08/03/2012  . Osteoarthrosis, unspecified whether generalized or localized, involving lower leg 06/29/2012  . Memory loss 06/29/2012  . Unable to ambulate  04/09/2012  . Frequent falls 04/09/2012  . Abrasion 04/09/2012    CBC    Component Value Date/Time   WBC 9.5 04/12/2012 1032   RBC 4.29 04/12/2012 1032   HGB 13.2 04/12/2012 1032   HCT 38.5 04/12/2012 1032   PLT 210 04/12/2012 1032   MCV 89.7 04/12/2012 1032   LYMPHSABS 1.1 04/12/2012 1032   MONOABS 1.0 04/12/2012 1032   EOSABS 0.0 04/12/2012 1032   BASOSABS 0.0 04/12/2012 1032    CMP     Component Value Date/Time   NA 139 04/12/2012 1032   K 3.9 04/12/2012 1032   CL 104  04/12/2012 1032   CO2 26 04/12/2012 1032   GLUCOSE 130* 04/12/2012 1032   BUN 18 04/12/2012 1032   CREATININE 0.54 04/12/2012 1032   CALCIUM 8.8 04/12/2012 1032   GFRNONAA 80* 04/12/2012 1032   GFRAA >90 04/12/2012 1032    Assessment and Plan  CKD (chronic kidney disease) stage 2, GFR 60-89 ml/min Has improved to stage 1, with GFR 95, CrCL -66 in 02/2014; per p wished=s many meds have been d/c; pt is on no BP meds and is on omega 3 only. Will continue to monitor.   Hyperlipidemia LDL goal <130 LDL -175. HDL -57 in 02/2014 TG -67; LDL is worse but other parameters better; pt is on fish oil only, won't take anything else; Plan-continue fish oil and continue to monitor   PVD (peripheral vascular disease) No leg pain or wounds; pt is on no ASA or anti-coag per her wish and with her age acceptable; Plan- continue to monitor   Osteoarthrosis, unspecified whether generalized or localized, involving lower leg Chronic and stable and uses tylenol prn and WC: Plan - continue current interventions   Dementia with behavioral disturbance Pt is on depakote TID for behavoirs but every time I see her, which is every day I'm here she is pleasant and seems happy     Margit HanksALEXANDER, ANNE D, MD

## 2014-05-01 ENCOUNTER — Encounter: Payer: Self-pay | Admitting: Internal Medicine

## 2014-05-01 NOTE — Assessment & Plan Note (Signed)
Pt is on depakote TID for behavoirs but every time I see her, which is every day I'm here she is pleasant and seems happy

## 2014-05-01 NOTE — Assessment & Plan Note (Signed)
LDL -175. HDL -57 in 02/2014 TG -67; LDL is worse but other parameters better; pt is on fish oil only, won't take anything else; Plan-continue fish oil and continue to monitor

## 2014-05-01 NOTE — Assessment & Plan Note (Signed)
Chronic and stable and uses tylenol prn and WC: Plan - continue current interventions

## 2014-05-01 NOTE — Assessment & Plan Note (Addendum)
Has improved to stage 1, with GFR 95, CrCL -66 in 02/2014; per p wished=s many meds have been d/c; pt is on no BP meds and is on omega 3 only. Will continue to monitor.

## 2014-05-01 NOTE — Assessment & Plan Note (Signed)
No leg pain or wounds; pt is on no ASA or anti-coag per her wish and with her age acceptable; Plan- continue to monitor

## 2014-08-02 ENCOUNTER — Encounter: Payer: Self-pay | Admitting: Internal Medicine

## 2014-08-02 ENCOUNTER — Non-Acute Institutional Stay (SKILLED_NURSING_FACILITY): Payer: Medicare Other | Admitting: Internal Medicine

## 2014-08-02 DIAGNOSIS — F39 Unspecified mood [affective] disorder: Secondary | ICD-10-CM

## 2014-08-02 DIAGNOSIS — F03918 Unspecified dementia, unspecified severity, with other behavioral disturbance: Secondary | ICD-10-CM

## 2014-08-02 DIAGNOSIS — IMO0002 Reserved for concepts with insufficient information to code with codable children: Secondary | ICD-10-CM

## 2014-08-02 DIAGNOSIS — M179 Osteoarthritis of knee, unspecified: Secondary | ICD-10-CM | POA: Diagnosis not present

## 2014-08-02 DIAGNOSIS — F0391 Unspecified dementia with behavioral disturbance: Secondary | ICD-10-CM

## 2014-08-02 DIAGNOSIS — M1710 Unilateral primary osteoarthritis, unspecified knee: Secondary | ICD-10-CM

## 2014-08-02 NOTE — Assessment & Plan Note (Signed)
Continues stable, not requiring any medications for this;Plan-cont to monitor

## 2014-08-02 NOTE — Assessment & Plan Note (Signed)
Continues without major declines and is on depakote for mood which has been very sucessful.

## 2014-08-02 NOTE — Progress Notes (Signed)
MRN: [id number] Name: Holly Rivera  Sex: female Age: 81 y.o. DOB: 29-Dec-1918  [facility] Facility/Room:103A Level Of Care: SNF Provider: Merrilee Seashore D Emergency Contacts: Extended Emergency Contact Information Primary Emergency Contact: Gagnon,Anita Address: [address]          [name] [address] [place] Home Phone: [phone] Relation: None  Code Status: DNR  Allergies: Penicillins  Chief Complaint  Patient presents with  . Medical Management of Chronic Issues    HPI: Patient is 81 y.o. female who is being seen for routine issues.  Past Medical History  Diagnosis Date  . Pneumonia   . Arthritis   . Depression     Past Surgical History  Procedure Laterality Date  . Appendectomy        Medication List       This list is accurate as of: 08/02/14  9:13 PM.  Always use your most recent med list.               acetaminophen 325 MG tablet  Commonly known as:  TYLENOL  Take 2 tablets (650 mg total) by mouth every 6 (six) hours as needed.     divalproex 125 MG capsule  Commonly known as:  DEPAKOTE SPRINKLE  Take 125 mg by mouth 3 (three) times daily.     Fish Oil 1000 MG Cpdr  Take 1,000 mg by mouth 2 (two) times daily.     nystatin powder  Commonly known as:  MYCOSTATIN  Apply topically 2 (two) times daily as needed. abd folds and thighs        No orders of the defined types were placed in this encounter.    Immunization History  Administered Date(s) Administered  . Influenza-Unspecified 11/25/2012    History  Substance Use Topics  . Smoking status: Never Smoker   . Smokeless tobacco: Not on file  . Alcohol Use: No    Review of Systems  DATA OBTAINED: from patient, nurse; asked me to help her with her shoes  But she was doing a pretty good job by herself GENERAL:  no fevers, fatigue, appetite changes SKIN: No itching, rash HEENT: No complaint RESPIRATORY: No cough, wheezing, SOB CARDIAC: No chest  pain, palpitations, lower extremity edema  GI: No abdominal pain, No N/V/D or constipation, No heartburn or reflux  GU: No dysuria, frequency or urgency, or incontinence  MUSCULOSKELETAL: No unrelieved bone/joint pain NEUROLOGIC: No headache, dizziness  PSYCHIATRIC: No overt anxiety or sadness  Filed Vitals:   08/02/14 1840  BP: 125/78  Pulse: 80  Temp: 97.1 F (36.2 C)  Resp: 19    Physical Exam  GENERAL APPEARANCE: Alert, conversant, No acute distress  SKIN: No diaphoresis rash HEENT: Unremarkable RESPIRATORY: Breathing is even, unlabored. Lung sounds are clear   CARDIOVASCULAR: Heart RRR no murmurs, rubs or gallops. No peripheral edema  GASTROINTESTINAL: Abdomen is soft, non-tender, not distended w/ normal bowel sounds.  GENITOURINARY: Bladder non tender, not distended  MUSCULOSKELETAL: No abnormal joints or musculature NEUROLOGIC: Cranial nerves 2-12 grossly intact. Moves all extremities PSYCHIATRIC: Mood and affect appropriate to situation with dementia, no behavioral issues  Patient Active Problem List   Diagnosis Date Noted  . Mood disorder 08/02/2014  . Hyperlipidemia LDL goal <130 06/21/2013  . PVD (peripheral vascular disease) 04/14/2013  . CKD (chronic kidney disease) stage 2, GFR 60-89 ml/min 04/14/2013  . Dementia with behavioral disturbance 02/15/2013  . Depression 11/15/2012  . Depressive psychosis 08/03/2012  . Osteoarthrosis, unspecified whether  generalized or localized, involving lower leg 06/29/2012  . Memory loss 06/29/2012  . Unable to ambulate 04/09/2012  . Frequent falls 04/09/2012  . Abrasion 04/09/2012    CBC    Component Value Date/Time   WBC 9.5 04/12/2012 1032   RBC 4.29 04/12/2012 1032   HGB 13.2 04/12/2012 1032   HCT 38.5 04/12/2012 1032   PLT 210 04/12/2012 1032   MCV 89.7 04/12/2012 1032   LYMPHSABS 1.1 04/12/2012 1032   MONOABS 1.0 04/12/2012 1032   EOSABS 0.0 04/12/2012 1032   BASOSABS 0.0 04/12/2012 1032    CMP      Component Value Date/Time   NA 139 04/12/2012 1032   K 3.9 04/12/2012 1032   CL 104 04/12/2012 1032   CO2 26 04/12/2012 1032   GLUCOSE 130* 04/12/2012 1032   BUN 18 04/12/2012 1032   CREATININE 0.54 04/12/2012 1032   CALCIUM 8.8 04/12/2012 1032   GFRNONAA 80* 04/12/2012 1032   GFRAA >90 04/12/2012 1032    Assessment and Plan  Osteoarthrosis, unspecified whether generalized or localized, involving lower leg Continues stable, not requiring any medications for this;Plan-cont to monitor   Mood disorder Chronic and stable on TID depakote.Pharm has rec GDR , but in past this has led to a decline, aggressive behavoirs, labile mood.;last valproic acid level -49;Plan - cont valproic acid   Dementia with behavioral disturbance Continues without major declines and is on depakote for mood which has been very sucessful.     Margit HanksALEXANDER, ANNE D, MD

## 2014-08-02 NOTE — Assessment & Plan Note (Signed)
Chronic and stable on TID depakote.Pharm has rec GDR , but in past this has led to a decline, aggressive behavoirs, labile mood.;last valproic acid level -49;Plan - cont valproic acid

## 2014-12-12 ENCOUNTER — Encounter (HOSPITAL_COMMUNITY): Payer: Self-pay | Admitting: *Deleted

## 2014-12-12 ENCOUNTER — Emergency Department (HOSPITAL_COMMUNITY)
Admission: EM | Admit: 2014-12-12 | Discharge: 2014-12-13 | Disposition: A | Discharge status: Home / Self Care | Payer: Medicare Other | Attending: Emergency Medicine | Admitting: Emergency Medicine

## 2014-12-12 DIAGNOSIS — Z8639 Personal history of other endocrine, nutritional and metabolic disease: Secondary | ICD-10-CM | POA: Diagnosis not present

## 2014-12-12 DIAGNOSIS — Z8701 Personal history of pneumonia (recurrent): Secondary | ICD-10-CM | POA: Diagnosis not present

## 2014-12-12 DIAGNOSIS — Y999 Unspecified external cause status: Secondary | ICD-10-CM | POA: Insufficient documentation

## 2014-12-12 DIAGNOSIS — F0390 Unspecified dementia without behavioral disturbance: Secondary | ICD-10-CM | POA: Diagnosis not present

## 2014-12-12 DIAGNOSIS — Z043 Encounter for examination and observation following other accident: Secondary | ICD-10-CM | POA: Diagnosis not present

## 2014-12-12 DIAGNOSIS — M1990 Unspecified osteoarthritis, unspecified site: Secondary | ICD-10-CM | POA: Insufficient documentation

## 2014-12-12 DIAGNOSIS — Y939 Activity, unspecified: Secondary | ICD-10-CM | POA: Insufficient documentation

## 2014-12-12 DIAGNOSIS — W19XXXA Unspecified fall, initial encounter: Secondary | ICD-10-CM | POA: Diagnosis not present

## 2014-12-12 DIAGNOSIS — Z880 Allergy status to penicillin: Secondary | ICD-10-CM | POA: Insufficient documentation

## 2014-12-12 DIAGNOSIS — Y92129 Unspecified place in nursing home as the place of occurrence of the external cause: Secondary | ICD-10-CM | POA: Diagnosis not present

## 2014-12-12 DIAGNOSIS — Z79899 Other long term (current) drug therapy: Secondary | ICD-10-CM | POA: Diagnosis not present

## 2014-12-12 HISTORY — DX: Hyperlipidemia, unspecified: E78.5

## 2014-12-12 HISTORY — DX: Repeated falls: R29.6

## 2014-12-12 HISTORY — DX: Major depressive disorder, single episode, unspecified: F32.9

## 2014-12-12 HISTORY — DX: Unspecified fall, initial encounter: W19.XXXA

## 2014-12-12 HISTORY — DX: Disorder of thyroid, unspecified: E07.9

## 2014-12-12 NOTE — ED Notes (Signed)
[facility] is from [facility], had unwitnessed all, pt with hx of falls. No deformities noted, no injuries noted.

## 2014-12-12 NOTE — ED Provider Notes (Signed)
CSN: [id number]     Arrival date & time 12/12/14  2223 History   First MD Initiated Contact with Patient 12/12/14 2228     Chief Complaint  Patient presents with  . Fall     (Consider location/radiation/quality/duration/timing/severity/associated sxs/prior Treatment) HPI Comments: Patient is a 81 year old female with a past medical history of arthritis, dementia, hyperlipidemia, and frequent falls who presents to the ED via EMS from [facility] after being found on the ground by staff. Patient does not remember what happened and staff does not know how she fell, as the fall was unwitnessed. Patient denies any pain at this time. No aggravating/alleviating factors. No other associated symptoms.    Past Medical History  Diagnosis Date  . Pneumonia   . Arthritis   . Depression   . Hyperlipidemia   . Major depressive disorder (HCC)   . Thyroid disease     hypothryriodism  . Falls    Past Surgical History  Procedure Laterality Date  . Appendectomy     History reviewed. No pertinent family history. Social History  Substance Use Topics  . Smoking status: Never Smoker   . Smokeless tobacco: None  . Alcohol Use: No   OB History    No data available     Review of Systems  Constitutional:       Fall  All other systems reviewed and are negative.     Allergies  Penicillins  Home Medications   Prior to Admission medications   Medication Sig Start Date End Date Taking? Authorizing Provider  acetaminophen (TYLENOL) 325 MG tablet Take 2 tablets (650 mg total) by mouth every 6 (six) hours as needed. 04/13/12   Dorothea Ogle, MD  divalproex (DEPAKOTE SPRINKLE) 125 MG capsule Take 125 mg by mouth 3 (three) times daily.     Historical Provider, MD  nystatin (MYCOSTATIN) powder Apply topically 2 (two) times daily as needed. abd folds and thighs    Historical Provider, MD  Omega-3 Fatty Acids (FISH OIL) 1000 MG CPDR Take 1,000 mg by mouth 2 (two) times daily.    Historical  Provider, MD   BP 128/62 mmHg  Pulse 72  Temp(Src) 97.5 F (36.4 C) (Oral)  Resp 18  SpO2 100% Physical Exam  Constitutional: She appears well-developed and well-nourished. No distress.  HENT:  Head: Normocephalic and atraumatic.  Eyes: Conjunctivae and EOM are normal. Pupils are equal, round, and reactive to light.  Neck:  c-collar intact.   Cardiovascular: Normal rate and regular rhythm.  Exam reveals no gallop and no friction rub.   No murmur heard. Pulmonary/Chest: Effort normal and breath sounds normal. She has no wheezes. She has no rales. She exhibits no tenderness.  Abdominal: Soft. She exhibits no distension. There is no tenderness. There is no rebound.  Musculoskeletal: Normal range of motion.  No obvious joint deformity.   Neurological: She is alert. Coordination normal.  Speech is goal-oriented. Moves limbs without ataxia. Patient oriented x2 secondary to dementia.   Skin: Skin is warm and dry.  No bruising or abrasions noted.   Psychiatric: She has a normal mood and affect. Her behavior is normal.  Nursing note and vitals reviewed.   ED Course  Procedures (including critical care time) Labs Review Labs Reviewed - No data to display  Imaging Review Ct Head Wo Contrast  12/13/2014  CLINICAL DATA:  Status post unwitnessed fall. Concern for head or cervical spine injury. Initial encounter. EXAM: CT HEAD WITHOUT CONTRAST CT CERVICAL  SPINE WITHOUT CONTRAST TECHNIQUE: Multidetector CT imaging of the head and cervical spine was performed following the standard protocol without intravenous contrast. Multiplanar CT image reconstructions of the cervical spine were also generated. COMPARISON:  CT of the head performed 04/09/2012. FINDINGS: CT HEAD FINDINGS There is no evidence of acute infarction, mass lesion, or intra- or extra-axial hemorrhage on CT. Prominence of the ventricles and sulci reflects moderate cortical volume loss. Mild cerebellar atrophy is noted. Scattered  periventricular and subcortical white matter change likely reflects small vessel ischemic microangiopathy. The brainstem and fourth ventricle are within normal limits. The basal ganglia are unremarkable in appearance. The cerebral hemispheres demonstrate grossly normal gray-white differentiation. No mass effect or midline shift is seen. There is no evidence of fracture; visualized osseous structures are unremarkable in appearance. The visualized portions of the orbits are within normal limits. The paranasal sinuses and mastoid air cells are well-aerated. No significant soft tissue abnormalities are seen. CT CERVICAL SPINE FINDINGS There is no evidence of acute fracture or subluxation. There is grade 1 anterolisthesis of C3 on C4, reflecting underlying facet disease. Multilevel disc space narrowing is noted along the lower cervical spine, with scattered anterior and posterior disc osteophyte complexes. Vertebral bodies demonstrate normal height. Prevertebral soft tissues are grossly unremarkable. The thyroid gland is unremarkable in appearance. The visualized lung apices are clear. No significant soft tissue abnormalities are seen. IMPRESSION: 1. No evidence of traumatic intracranial injury or fracture. 2. No evidence of acute fracture or subluxation along the cervical spine. 3. Moderate cortical volume loss and scattered small vessel ischemic microangiopathy. 4. Mild degenerative change noted along the cervical spine, with grade 1 anterolisthesis of C3 on C4. Electronically Signed   By: Roanna Raider M.D.   On: 12/13/2014 01:48   Ct Cervical Spine Wo Contrast  12/13/2014  CLINICAL DATA:  Status post unwitnessed fall. Concern for head or cervical spine injury. Initial encounter. EXAM: CT HEAD WITHOUT CONTRAST CT CERVICAL SPINE WITHOUT CONTRAST TECHNIQUE: Multidetector CT imaging of the head and cervical spine was performed following the standard protocol without intravenous contrast. Multiplanar CT image  reconstructions of the cervical spine were also generated. COMPARISON:  CT of the head performed 04/09/2012. FINDINGS: CT HEAD FINDINGS There is no evidence of acute infarction, mass lesion, or intra- or extra-axial hemorrhage on CT. Prominence of the ventricles and sulci reflects moderate cortical volume loss. Mild cerebellar atrophy is noted. Scattered periventricular and subcortical white matter change likely reflects small vessel ischemic microangiopathy. The brainstem and fourth ventricle are within normal limits. The basal ganglia are unremarkable in appearance. The cerebral hemispheres demonstrate grossly normal gray-white differentiation. No mass effect or midline shift is seen. There is no evidence of fracture; visualized osseous structures are unremarkable in appearance. The visualized portions of the orbits are within normal limits. The paranasal sinuses and mastoid air cells are well-aerated. No significant soft tissue abnormalities are seen. CT CERVICAL SPINE FINDINGS There is no evidence of acute fracture or subluxation. There is grade 1 anterolisthesis of C3 on C4, reflecting underlying facet disease. Multilevel disc space narrowing is noted along the lower cervical spine, with scattered anterior and posterior disc osteophyte complexes. Vertebral bodies demonstrate normal height. Prevertebral soft tissues are grossly unremarkable. The thyroid gland is unremarkable in appearance. The visualized lung apices are clear. No significant soft tissue abnormalities are seen. IMPRESSION: 1. No evidence of traumatic intracranial injury or fracture. 2. No evidence of acute fracture or subluxation along the cervical spine. 3. Moderate cortical volume loss  and scattered small vessel ischemic microangiopathy. 4. Mild degenerative change noted along the cervical spine, with grade 1 anterolisthesis of C3 on C4. Electronically Signed   By: Roanna RaiderJeffery  Chang M.D.   On: 12/13/2014 01:48   I have personally reviewed and  evaluated these images and lab results as part of my medical decision-making.   EKG Interpretation None      MDM   Final diagnoses:  Fall, initial encounter    11:27 PM  CT head and cervical spine pending. No neuro deficits noted. Patient has no other injuries. She has no complaints at this time.   1:55 AM Imaging unremarkable for acute changes. Patient stable for discharge.    Emilia BeckKaitlyn Khristen Cheyney, PA-C 12/13/14 0155  Donnetta HutchingBrian Cook, MD 12/13/14 [phone]

## 2014-12-13 ENCOUNTER — Emergency Department (HOSPITAL_COMMUNITY): Payer: Medicare Other

## 2014-12-13 DIAGNOSIS — Z043 Encounter for examination and observation following other accident: Secondary | ICD-10-CM | POA: Diagnosis not present

## 2014-12-13 LAB — BASIC METABOLIC PANEL
BUN: 12 mg/dL (ref 4–21)
CREATININE: 0.6 mg/dL (ref 0.5–1.1)
Glucose: 79 mg/dL
POTASSIUM: 3.9 mmol/L (ref 3.4–5.3)
Sodium: 141 mmol/L (ref 137–147)

## 2014-12-13 LAB — LIPID PANEL
CHOLESTEROL: 254 mg/dL — AB (ref 0–200)
HDL: 59 mg/dL (ref 35–70)
LDL Cholesterol: 189 mg/dL
TRIGLYCERIDES: 53 mg/dL (ref 40–160)

## 2014-12-13 LAB — HEPATIC FUNCTION PANEL
ALT: 11 U/L (ref 7–35)
AST: 15 U/L (ref 13–35)
Alkaline Phosphatase: 55 U/L (ref 25–125)

## 2015-01-09 ENCOUNTER — Non-Acute Institutional Stay (SKILLED_NURSING_FACILITY): Payer: Medicare Other | Admitting: Internal Medicine

## 2015-01-09 ENCOUNTER — Encounter: Payer: Self-pay | Admitting: Internal Medicine

## 2015-01-09 DIAGNOSIS — F329 Major depressive disorder, single episode, unspecified: Secondary | ICD-10-CM | POA: Diagnosis not present

## 2015-01-09 DIAGNOSIS — R7989 Other specified abnormal findings of blood chemistry: Secondary | ICD-10-CM

## 2015-01-09 DIAGNOSIS — M179 Osteoarthritis of knee, unspecified: Secondary | ICD-10-CM

## 2015-01-09 DIAGNOSIS — I739 Peripheral vascular disease, unspecified: Secondary | ICD-10-CM

## 2015-01-09 DIAGNOSIS — M1710 Unilateral primary osteoarthritis, unspecified knee: Secondary | ICD-10-CM

## 2015-01-09 DIAGNOSIS — IMO0002 Reserved for concepts with insufficient information to code with codable children: Secondary | ICD-10-CM

## 2015-01-09 DIAGNOSIS — E785 Hyperlipidemia, unspecified: Secondary | ICD-10-CM | POA: Diagnosis not present

## 2015-01-09 DIAGNOSIS — F29 Unspecified psychosis not due to a substance or known physiological condition: Secondary | ICD-10-CM

## 2015-01-09 DIAGNOSIS — F32A Depression, unspecified: Secondary | ICD-10-CM
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
## 2015-02-13 ENCOUNTER — Non-Acute Institutional Stay (SKILLED_NURSING_FACILITY): Payer: Medicare Other | Admitting: Internal Medicine

## 2015-02-13 DIAGNOSIS — F32A Depression, unspecified: Secondary | ICD-10-CM

## 2015-02-13 DIAGNOSIS — F329 Major depressive disorder, single episode, unspecified: Secondary | ICD-10-CM

## 2015-02-13 DIAGNOSIS — E785 Hyperlipidemia, unspecified: Secondary | ICD-10-CM | POA: Diagnosis not present

## 2015-02-13 DIAGNOSIS — F0391 Unspecified dementia with behavioral disturbance: Secondary | ICD-10-CM | POA: Diagnosis not present

## 2015-02-13 DIAGNOSIS — R7989 Other specified abnormal findings of blood chemistry: Secondary | ICD-10-CM

## 2015-02-13 DIAGNOSIS — N182 Chronic kidney disease, stage 2 (mild): Secondary | ICD-10-CM

## 2015-02-13 DIAGNOSIS — F29 Unspecified psychosis not due to a substance or known physiological condition: Secondary | ICD-10-CM

## 2015-02-13 DIAGNOSIS — F03918 Unspecified dementia, unspecified severity, with other behavioral disturbance: Secondary | ICD-10-CM

## 2015-02-13 DIAGNOSIS — I739 Peripheral vascular disease, unspecified: Secondary | ICD-10-CM

## 2015-02-15 ENCOUNTER — Encounter: Payer: Self-pay | Admitting: Internal Medicine

## 2015-02-15 NOTE — Progress Notes (Signed)
Patient ID: Holly Rivera, female   DOB: [birth year], 81 y.o.   MRN: [id number]    DATE: 02/13/15  Location:  [facility]    Place of Service: SNF (31)   Extended Emergency Contact Information Primary Emergency Contact: Gagnon,Anita Address: [address]          [name] [address] [place] Mobile Phone: [phone] Relation: None  Advanced Directive information  DNR  Chief Complaint  Patient presents with  . Medical Management of Chronic Issues    HPI:  81 yo female long term resident seen today for f/u. She has no c/o. She is a poor historian due to dementia and psych issues. Hx obtained from chart. No nursing issues. No falls  MDD with psychosis - stable on lamictal and risperdal  Arthritis - stable on Tylenol prn  Thyroid - currently untreated. TFTs monitored. She is asymptomatic  PAD - stable   CKD- Cr nml in Oct 2016  Elevated Nh3 level - stable on lactulose  She takes Vit D and fish oil also.   Past Medical History  Diagnosis Date  . Pneumonia   . Arthritis   . Depression   . Hyperlipidemia   . Major depressive disorder (HCC)   . Thyroid disease     hypothryriodism  . Falls     Past Surgical History  Procedure Laterality Date  . Appendectomy      No care team member to display  Social History   Social History  . Marital Status: Widowed    Spouse Name: N/A  . Number of Children: N/A  . Years of Education: N/A   Occupational History  . Not on file.   Social History Main Topics  . Smoking status: Never Smoker   . Smokeless tobacco: Not on file  . Alcohol Use: No  . Drug Use: Yes  . Sexual Activity: No   Other Topics Concern  . Not on file   Social History Narrative     reports that she has never smoked. She does not have any smokeless tobacco history on file. She reports that she uses illicit drugs. She reports that she does not drink alcohol.  Immunization History  Administered Date(s)  Administered  . Influenza-Unspecified 11/25/2012, 12/07/2014    Allergies  Allergen Reactions  . Penicillins Anaphylaxis    Medications: Patient's Medications  New Prescriptions   No medications on file  Previous Medications   ACETAMINOPHEN (TYLENOL) 325 MG TABLET    Take 2 tablets (650 mg total) by mouth every 6 (six) hours as needed.   DIVALPROEX (DEPAKOTE SPRINKLE) 125 MG CAPSULE    Take 125 mg by mouth 3 (three) times daily.    LACTULOSE, ENCEPHALOPATHY, (CHRONULAC) 10 GM/15ML SOLN    Take by mouth.   LAMOTRIGINE (LAMICTAL) 100 MG TABLET    Take by mouth.   NYSTATIN (MYCOSTATIN) POWDER    Apply topically 2 (two) times daily as needed. abd folds and thighs   OMEGA-3 FATTY ACIDS (FISH OIL) 1000 MG CPDR    Take 1,000 mg by mouth 2 (two) times daily.   RISPERIDONE (RISPERDAL) 0.25 MG TABLET    Take by mouth.  Modified Medications   No medications on file  Discontinued Medications   No medications on file    Review of Systems  Unable to perform ROS: Psychiatric disorder    Filed Vitals:   02/13/15 2308  BP: 121/75  Pulse: 77  Temp: 97 F (36.1 C)  There is no weight on file to calculate BMI.  Physical Exam  Constitutional: She appears well-developed.  Frail appearing in NAD. Lying in bed  HENT:  Mouth/Throat: Oropharynx is clear and moist. No oropharyngeal exudate.  Eyes: Pupils are equal, round, and reactive to light. No scleral icterus.  Neck: Neck supple. Carotid bruit is not present. No tracheal deviation present. No thyromegaly present.  Cardiovascular: Normal rate, regular rhythm, normal heart sounds and intact distal pulses.  Exam reveals no gallop and no friction rub.   No murmur heard. No LE edema b/l. no calf TTP.   Pulmonary/Chest: Effort normal and breath sounds normal. No stridor. No respiratory distress. She has no wheezes. She has no rales.  Abdominal: Soft. Bowel sounds are normal. She exhibits no distension and no mass. There is no hepatomegaly.  There is no tenderness. There is no rebound and no guarding.  Musculoskeletal: She exhibits edema.  Lymphadenopathy:    She has no cervical adenopathy.  Neurological: She is alert.  Skin: Skin is warm and dry. No rash noted.  Psychiatric: She has a normal mood and affect. Her behavior is normal.     Labs reviewed: Nursing Home on 01/09/2015  Component Date Value Ref Range Status  . Glucose 12/13/2014 79   Final  . BUN 12/13/2014 12  4 - 21 mg/dL Final  . Creatinine 32/44/010210/18/2016 0.6  0.5 - 1.1 mg/dL Final  . Potassium 72/53/664410/18/2016 3.9  3.4 - 5.3 mmol/L Final  . Sodium 12/13/2014 141  137 - 147 mmol/L Final  . Triglycerides 12/13/2014 53  40 - 160 mg/dL Final  . Cholesterol 03/47/425910/18/2016 254* 0 - 200 mg/dL Final  . HDL 56/38/756410/18/2016 59  35 - 70 mg/dL Final  . LDL Cholesterol 12/13/2014 189   Final  . Alkaline Phosphatase 12/13/2014 55  25 - 125 U/L Final  . ALT 12/13/2014 11  7 - 35 U/L Final  . AST 12/13/2014 15  13 - 35 U/L Final    No results found.   Assessment/Plan   ICD-9-CM ICD-10-CM   1. Dementia with behavioral disturbance 294.21 F03.91   2. Psychosis, unspecified psychosis type 298.9 F29   3. Depression 311 F32.9   4. Hyperlipidemia LDL goal <130 272.4 E78.5   5. PVD (peripheral vascular disease) (HCC) 443.9 I73.9   6. Increased ammonia level  790.6 R79.89   7. CKD (chronic kidney disease) stage 2, GFR 60-89 ml/min 585.2 N18.2    [facility]  Provider to follow  Pt is medically stable on current tx plan. Continue current medications as ordered. PT/OT/ST as indicated. Will follow  Holly Rivera, D. O., F. A. C. O. I.  [facility] [address] [place], [place] [address] [phone] Cell (Monday-Friday 8 AM - 5 PM) [phone] After 5 PM and follow prompts

## 2015-03-16 ENCOUNTER — Encounter: Payer: Self-pay | Admitting: Internal Medicine

## 2015-03-16 ENCOUNTER — Non-Acute Institutional Stay (SKILLED_NURSING_FACILITY): Payer: Medicare Other | Admitting: Internal Medicine

## 2015-03-16 DIAGNOSIS — F333 Major depressive disorder, recurrent, severe with psychotic symptoms: Secondary | ICD-10-CM | POA: Diagnosis not present

## 2015-03-16 DIAGNOSIS — F0391 Unspecified dementia with behavioral disturbance: Secondary | ICD-10-CM

## 2015-03-16 DIAGNOSIS — F29 Unspecified psychosis not due to a substance or known physiological condition: Secondary | ICD-10-CM | POA: Diagnosis not present

## 2015-03-16 DIAGNOSIS — F03918 Unspecified dementia, unspecified severity, with other behavioral disturbance: Secondary | ICD-10-CM

## 2015-03-16 DIAGNOSIS — R7989 Other specified abnormal findings of blood chemistry: Secondary | ICD-10-CM

## 2015-03-16 DIAGNOSIS — N182 Chronic kidney disease, stage 2 (mild): Secondary | ICD-10-CM

## 2015-03-16 NOTE — Progress Notes (Signed)
Patient ID: Holly Rivera, female   DOB: [birth year], 82 y.o.   MRN: [id number]    DATE: 1/191/7  Location:  [facility]    Place of Service: SNF (31)   Extended Emergency Contact Information Primary Emergency Contact: Gagnon,Anita Address: [address]          [name] [address] [place] Mobile Phone: [phone] Relation: None  Advanced Directive information  DNR/DNH  Chief Complaint  Patient presents with  . Medical Management of Chronic Issues    [facility]    HPI:  82 yo female long term resident seen today for f/u. She reports feeling depressed and "90% of people don't care about me". No nursing issues. She sleeps well. No falls.she is a poor historian due to psych d/o. Hx obtained from chart  MDD with psychosis/dementia - stable on depakote, lamictal and risperdal   Arthritis - stable on Tylenol prn  Thyroid - currently untreated. TFTs monitored. She is asymptomatic  CKD - Cr 0.6 in Oct 2016   Elevated Nh3 level - stable on lactulose  She takes Vit D and fish oil also. Uses nystatin pwder prn  Past Medical History  Diagnosis Date  . Pneumonia   . Arthritis   . Depression   . Hyperlipidemia   . Major depressive disorder (HCC)   . Thyroid disease     hypothryriodism  . Falls     Past Surgical History  Procedure Laterality Date  . Appendectomy      No care team member to display  Social History   Social History  . Marital Status: Widowed    Spouse Name: N/A  . Number of Children: N/A  . Years of Education: N/A   Occupational History  . Not on file.   Social History Main Topics  . Smoking status: Never Smoker   . Smokeless tobacco: Not on file  . Alcohol Use: No  . Drug Use: Yes  . Sexual Activity: No   Other Topics Concern  . Not on file   Social History Narrative     reports that she has never smoked. She does not have any smokeless tobacco history on file. She reports that she uses illicit drugs.  She reports that she does not drink alcohol.  Immunization History  Administered Date(s) Administered  . Influenza-Unspecified 11/25/2012, 12/07/2014    Allergies  Allergen Reactions  . Penicillins Anaphylaxis    Medications: Patient's Medications  New Prescriptions   No medications on file  Previous Medications   ACETAMINOPHEN (TYLENOL) 325 MG TABLET    Take 2 tablets (650 mg total) by mouth every 6 (six) hours as needed.   DIVALPROEX (DEPAKOTE SPRINKLE) 125 MG CAPSULE    Take 125 mg by mouth 3 (three) times daily.    LACTULOSE, ENCEPHALOPATHY, (CHRONULAC) 10 GM/15ML SOLN    Take by mouth.   LAMOTRIGINE (LAMICTAL) 100 MG TABLET    Take by mouth.   NYSTATIN (MYCOSTATIN) POWDER    Apply topically 2 (two) times daily as needed. abd folds and thighs   OMEGA-3 FATTY ACIDS (FISH OIL) 1000 MG CPDR    Take 1,000 mg by mouth 2 (two) times daily.   RISPERIDONE (RISPERDAL) 0.25 MG TABLET    Take by mouth.  Modified Medications   No medications on file  Discontinued Medications   No medications on file    Review of Systems  Unable to perform ROS: Psychiatric disorder    Filed Vitals:   03/16/15 1725  BP: 100/57  Pulse: 95  Temp: 97.6 F (36.4 C)  SpO2: 93%   There is no weight on file to calculate BMI.  Physical Exam  Constitutional: She appears well-developed.  Lying in bed in the dark in NAD, frail appearing  HENT:  Mouth/Throat: Oropharynx is clear and moist. No oropharyngeal exudate.  Eyes: Pupils are equal, round, and reactive to light. No scleral icterus.  Neck: Neck supple. Carotid bruit is not present. No tracheal deviation present. No thyromegaly present.  Cardiovascular: Normal rate, regular rhythm and intact distal pulses.  Exam reveals no gallop and no friction rub.   Murmur (1/6 SEM) heard. Trace b/l LE edema. No calf TTP  Pulmonary/Chest: Effort normal and breath sounds normal. No stridor. No respiratory distress. She has no wheezes. She has no rales.    Abdominal: Soft. Bowel sounds are normal. She exhibits no distension and no mass. There is no hepatomegaly. There is no tenderness. There is no rebound and no guarding.  Musculoskeletal: She exhibits edema.  Lymphadenopathy:    She has no cervical adenopathy.  Neurological: She is alert. She has normal reflexes.  Skin: Skin is warm and dry. No rash noted.  Psychiatric: Her behavior is normal. Her mood appears anxious. She exhibits a depressed mood.  crying     Labs reviewed: Nursing Home on 01/09/2015  Component Date Value Ref Range Status  . Glucose 12/13/2014 79   Final  . BUN 12/13/2014 12  4 - 21 mg/dL Final  . Creatinine 16/11/9602 0.6  0.5 - 1.1 mg/dL Final  . Potassium 54/10/8117 3.9  3.4 - 5.3 mmol/L Final  . Sodium 12/13/2014 141  137 - 147 mmol/L Final  . Triglycerides 12/13/2014 53  40 - 160 mg/dL Final  . Cholesterol 14/78/2956 254* 0 - 200 mg/dL Final  . HDL 21/30/8657 59  35 - 70 mg/dL Final  . LDL Cholesterol 12/13/2014 189   Final  . Alkaline Phosphatase 12/13/2014 55  25 - 125 U/L Final  . ALT 12/13/2014 11  7 - 35 U/L Final  . AST 12/13/2014 15  13 - 35 U/L Final    No results found.   Assessment/Plan   ICD-9-CM ICD-10-CM   1. Severe episode of recurrent major depressive disorder, with psychotic features (HCC) 296.34 F33.3   2. Psychosis, unspecified psychosis type 298.9 F29   3. Dementia with behavioral disturbance 294.21 F03.91   4. Increased ammonia level 790.6 R79.89   5. CKD (chronic kidney disease) stage 2, GFR 60-89 ml/min 585.2 N18.2     [facility] provider to follow  Pt is medically stable on current tx plan. Continue current medications as ordered. PT/OT/ST as indicated. Will follow  Eithel Ryall S. Ancil Linsey  [facility] [address] [place], [place] [address] [phone] Cell (Monday-Friday 8 AM - 5 PM) [phone] After 5 PM and follow prompts

## 2015-03-17 LAB — BASIC METABOLIC PANEL
BUN: 12 mg/dL (ref 4–21)
CREATININE: 0.5 mg/dL (ref 0.5–1.1)
Glucose: 78 mg/dL
POTASSIUM: 6.8 mmol/L — AB (ref 3.4–5.3)
SODIUM: 137 mmol/L (ref 137–147)

## 2015-03-17 LAB — CBC AND DIFFERENTIAL
HCT: 38 % (ref 36–46)
HEMOGLOBIN: 12.7 g/dL (ref 12.0–16.0)
Platelets: 344 10*3/uL (ref 150–399)
WBC: 7.3 10*3/mL

## 2015-03-17 LAB — LIPID PANEL
CHOLESTEROL: 247 mg/dL — AB (ref 0–200)
HDL: 38 mg/dL (ref 35–70)
LDL Cholesterol: 194 mg/dL
TRIGLYCERIDES: 77 mg/dL (ref 40–160)

## 2015-03-17 LAB — HEPATIC FUNCTION PANEL
ALT: 13 U/L (ref 7–35)
AST: 53 U/L — AB (ref 13–35)
Alkaline Phosphatase: 49 U/L (ref 25–125)
Bilirubin, Total: 0.4 mg/dL

## 2015-03-22 LAB — HEPATIC FUNCTION PANEL
ALT: 13 U/L (ref 7–35)
ALT: 9 U/L (ref 7–35)
AST: 13 U/L (ref 13–35)
AST: 9 U/L — AB (ref 13–35)
Alkaline Phosphatase: 54 U/L (ref 25–125)
Alkaline Phosphatase: 54 U/L (ref 25–125)
Bilirubin, Total: 0.8 mg/dL
Bilirubin, Total: 0.8 mg/dL

## 2015-03-22 LAB — BASIC METABOLIC PANEL
BUN: 10 mg/dL (ref 4–21)
CREATININE: 0.4 mg/dL — AB (ref 0.5–1.1)
Glucose: 77 mg/dL
Potassium: 3.4 mmol/L (ref 3.4–5.3)
Sodium: 138 mmol/L (ref 137–147)

## 2015-05-01 ENCOUNTER — Non-Acute Institutional Stay (SKILLED_NURSING_FACILITY): Payer: Medicare Other | Admitting: Internal Medicine

## 2015-05-01 ENCOUNTER — Encounter: Payer: Self-pay | Admitting: Internal Medicine

## 2015-05-01 DIAGNOSIS — R7989 Other specified abnormal findings of blood chemistry: Secondary | ICD-10-CM | POA: Diagnosis not present

## 2015-05-01 DIAGNOSIS — F0391 Unspecified dementia with behavioral disturbance: Secondary | ICD-10-CM

## 2015-05-01 DIAGNOSIS — E785 Hyperlipidemia, unspecified: Secondary | ICD-10-CM

## 2015-05-01 DIAGNOSIS — F333 Major depressive disorder, recurrent, severe with psychotic symptoms: Secondary | ICD-10-CM | POA: Insufficient documentation

## 2015-05-01 DIAGNOSIS — I739 Peripheral vascular disease, unspecified: Secondary | ICD-10-CM

## 2015-05-01 DIAGNOSIS — N182 Chronic kidney disease, stage 2 (mild): Secondary | ICD-10-CM | POA: Diagnosis not present

## 2015-05-01 DIAGNOSIS — F03918 Unspecified dementia, unspecified severity, with other behavioral disturbance: Secondary | ICD-10-CM

## 2015-05-01 NOTE — Progress Notes (Signed)
Patient ID: Holly Rivera, female   DOB: 07-07-1918, 82 y.o.   MRN: [id number]    DATE: 05/01/15  Location:  [facility]    Place of Service: SNF (31)   Extended Emergency Contact Information Primary Emergency Contact: Gagnon,Anita Address[address]          [place] [id number] [place] Mobile Phone: [phone] Relation: None  Advanced Directive information  DNR/DNH  Chief Complaint  Patient presents with  . Medical Management of Chronic Issues    [facility]    HPI:  82 yo female long term resident seen today for f/u. She has no c/o today.  She is a poor historian due to dementia/psych issues. Hx obtained from chart. No nursing issues. No falls  MDD with psychosis/dementia - mood stable on depakote, lamictal and risperdal. Cognitively declining  Arthritis - stable on Tylenol prn  Thyroid - currently untreated. TFTs monitored. She is asymptomatic  CKD - Cr 0.6 in Oct 2016   Elevated Nh3 level - stable on lactulose  She takes Vit D and fish oil also. LDL 189 in Oct 2016. Uses nystatin pwder prn  Past Medical History  Diagnosis Date  . Pneumonia   . Arthritis   . Depression   . Hyperlipidemia   . Major depressive disorder (HCC)   . Thyroid disease     hypothryriodism  . Falls     Past Surgical History  Procedure Laterality Date  . Appendectomy      No care team member to display  Social History   Social History  . Marital Status: Widowed    Spouse Name: N/A  . Number of Children: N/A  . Years of Education: N/A   Occupational History  . Not on file.   Social History Main Topics  . Smoking status: Never Smoker   . Smokeless tobacco: Not on file  . Alcohol Use: No  . Drug Use: Yes  . Sexual Activity: No   Other Topics Concern  . Not on file   Social History Narrative     reports that she has never smoked. She does not have any smokeless tobacco history on file. She reports that she uses illicit drugs. She  reports that she does not drink alcohol.  Immunization History  Administered Date(s) Administered  . Influenza-Unspecified 11/25/2012, 12/07/2014    Allergies  Allergen Reactions  . Penicillins Anaphylaxis    Medications: Patient's Medications  New Prescriptions   No medications on file  Previous Medications   ACETAMINOPHEN (TYLENOL) 325 MG TABLET    Take 2 tablets (650 mg total) by mouth every 6 (six) hours as needed.   DIVALPROEX (DEPAKOTE SPRINKLE) 125 MG CAPSULE    Take 125 mg by mouth 3 (three) times daily.    LACTULOSE, ENCEPHALOPATHY, (CHRONULAC) 10 GM/15ML SOLN    Take by mouth.   LAMOTRIGINE (LAMICTAL) 100 MG TABLET    Take by mouth.   NYSTATIN (MYCOSTATIN) POWDER    Apply topically 2 (two) times daily as needed. abd folds and thighs   OMEGA-3 FATTY ACIDS (FISH OIL) 1000 MG CPDR    Take 1,000 mg by mouth 2 (two) times daily.   RISPERIDONE (RISPERDAL) 0.25 MG TABLET    Take by mouth.  Modified Medications   No medications on file  Discontinued Medications   No medications on file    Review of Systems  Unable to perform ROS: Dementia    Filed Vitals:   05/01/15 1737  BP:  121/71  Pulse: 80  Temp: 98 F (36.7 C)  Weight: 135 lb 3.2 oz (61.326 kg)   Body mass index is 28.26 kg/(m^2).  Physical Exam  Constitutional: She appears well-developed.  Frail appearing, lying in bed resting but easily awakened  HENT:  Mouth/Throat: Oropharynx is clear and moist. No oropharyngeal exudate.  Eyes: Pupils are equal, round, and reactive to light. No scleral icterus.  Neck: Neck supple. Carotid bruit is not present. No tracheal deviation present. No thyromegaly present.  Cardiovascular: Normal rate, regular rhythm and intact distal pulses.  Exam reveals no gallop and no friction rub.   Murmur (1/6 SEM) heard. No LE edema b/l. no calf TTP.   Pulmonary/Chest: Effort normal and breath sounds normal. No stridor. No respiratory distress. She has no wheezes. She has no rales.    Abdominal: Soft. Bowel sounds are normal. She exhibits no distension and no mass. There is no hepatomegaly. There is no tenderness. There is no rebound and no guarding.  Musculoskeletal: She exhibits edema and tenderness.  Lymphadenopathy:    She has no cervical adenopathy.  Neurological: She is alert.  Skin: Skin is warm and dry. No rash noted.  Psychiatric: Her behavior is normal. Her mood appears anxious.     Labs reviewed: No visits with results within 3 Month(s) from this visit. Latest known visit with results is:  Nursing Home on 01/09/2015  Component Date Value Ref Range Status  . Glucose 12/13/2014 79   Final  . BUN 12/13/2014 12  4 - 21 mg/dL Final  . Creatinine 40/98/1191 0.6  0.5 - 1.1 mg/dL Final  . Potassium 47/82/9562 3.9  3.4 - 5.3 mmol/L Final  . Sodium 12/13/2014 141  137 - 147 mmol/L Final  . Triglycerides 12/13/2014 53  40 - 160 mg/dL Final  . Cholesterol 13/09/6576 254* 0 - 200 mg/dL Final  . HDL 46/96/2952 59  35 - 70 mg/dL Final  . LDL Cholesterol 12/13/2014 189   Final  . Alkaline Phosphatase 12/13/2014 55  25 - 125 U/L Final  . ALT 12/13/2014 11  7 - 35 U/L Final  . AST 12/13/2014 15  13 - 35 U/L Final    No results found.   Assessment/Plan   ICD-9-CM ICD-10-CM   1. Dementia with behavioral disturbance 294.21 F03.91   2. Severe episode of recurrent major depressive disorder, with psychotic features (HCC) 296.34 F33.3   3. CKD (chronic kidney disease) stage 2, GFR 60-89 ml/min 585.2 N18.2   4. Increased ammonia level 790.6 R79.89   5. Hyperlipidemia LDL goal <130 272.4 E78.5   6. PVD (peripheral vascular disease) (HCC) 443.9 I73.9     [facility] provider to follow  Pt is medically stable on current tx plan. Continue current medications as ordered. PT/OT/ST as indicated. Will follow  Cabela Pacifico S. Ancil Linsey  [facility] [address] [place], [place] [address] [phone] Cell (Monday-Friday 8 AM - 5  PM) [phone] After 5 PM and follow prompts

## 2015-06-05 ENCOUNTER — Non-Acute Institutional Stay (SKILLED_NURSING_FACILITY): Payer: Medicare Other | Admitting: Internal Medicine

## 2015-06-05 ENCOUNTER — Encounter: Payer: Self-pay | Admitting: Internal Medicine

## 2015-06-05 DIAGNOSIS — I739 Peripheral vascular disease, unspecified: Secondary | ICD-10-CM

## 2015-06-05 DIAGNOSIS — E785 Hyperlipidemia, unspecified: Secondary | ICD-10-CM | POA: Diagnosis not present

## 2015-06-05 DIAGNOSIS — F0391 Unspecified dementia with behavioral disturbance: Secondary | ICD-10-CM | POA: Diagnosis not present

## 2015-06-05 DIAGNOSIS — N182 Chronic kidney disease, stage 2 (mild): Secondary | ICD-10-CM

## 2015-06-05 DIAGNOSIS — F333 Major depressive disorder, recurrent, severe with psychotic symptoms: Secondary | ICD-10-CM | POA: Diagnosis not present

## 2015-06-05 DIAGNOSIS — F03918 Unspecified dementia, unspecified severity, with other behavioral disturbance: Secondary | ICD-10-CM

## 2015-06-05 NOTE — Progress Notes (Signed)
Patient ID: Holly Rivera, female   DOB: Oct 23, 1918, 82 y.o.   MRN: [id number]    DATE: 06/05/15  Location:  [facility]  Nursing Home Room Number: 103 A Place of Service: SNF (31)   Extended Emergency Contact Information Primary Emergency Contact: Rivera,Holly Address: [address]          [name] [address] [place] Mobile Phone: [phone] Relation: None  Advanced Directive information Does patient have an advance directive?: Yes, Type of Advance Directive: Out of facility DNR (pink MOST or yellow form), Does patient want to make changes to advanced directive?: No - Patient declined DNR/DNH Chief Complaint  Patient presents with  . Medical Management of Chronic Issues    Routine Visit; [facility]    HPI:   82 yo female long term resident seen today for f/u. She has no c/o today.  She is a poor historian due to dementia/psych issues. Hx obtained from chart. No nursing issues. No falls  MDD with psychosis/dementia - mood stable on lamictal and risperdal. Cognitively declining. Albumin 3.1  Arthritis - stable on Tylenol prn  Thyroid - currently untreated. TFTs monitored. She is asymptomatic  CKD - Cr 0.4   Elevated Nh3 level - stable. No longer takes lactulose  Hyperlipidemia - takes fish oil. LDL 194. No statin due to frail state  She takes Vit D also.   Past Medical History  Diagnosis Date  . Pneumonia   . Arthritis   . Depression   . Hyperlipidemia   . Major depressive disorder (HCC)   . Thyroid disease     hypothryriodism  . Falls     Past Surgical History  Procedure Laterality Date  . Appendectomy      No care team member to display  Social History   Social History  . Marital Status: Widowed    Spouse Name: N/A  . Number of Children: N/A  . Years of Education: N/A   Occupational History  . Not on file.   Social History Main Topics  . Smoking status: Never Smoker   . Smokeless tobacco: Not on file  .  Alcohol Use: No  . Drug Use: Yes  . Sexual Activity: No   Other Topics Concern  . Not on file   Social History Narrative     reports that she has never smoked. She does not have any smokeless tobacco history on file. She reports that she uses illicit drugs. She reports that she does not drink alcohol.  Immunization History  Administered Date(s) Administered  . Influenza-Unspecified 11/25/2012, 12/07/2014    Allergies  Allergen Reactions  . Penicillins Anaphylaxis    Medications: Patient's Medications  New Prescriptions   No medications on file  Previous Medications   ACETAMINOPHEN (TYLENOL) 325 MG TABLET    Take 2 tablets (650 mg total) by mouth every 6 (six) hours as needed.   CHOLECALCIFEROL (VITAMIN D3) 50000 UNITS CAPS    Take 1 capsule by mouth every 30 (thirty) days.   LAMOTRIGINE (LAMICTAL) 100 MG TABLET    Take by mouth.   OMEGA-3 FATTY ACIDS (FISH OIL) 1000 MG CPDR    Take 1,000 mg by mouth 2 (two) times daily.   RISPERIDONE (RISPERDAL) 0.25 MG TABLET    Take by mouth.  Modified Medications   No medications on file  Discontinued Medications   DIVALPROEX (DEPAKOTE SPRINKLE) 125 MG CAPSULE    Take 125 mg by mouth 3 (three) times daily.  LACTULOSE, ENCEPHALOPATHY, (CHRONULAC) 10 GM/15ML SOLN    Take by mouth.   NYSTATIN (MYCOSTATIN) POWDER    Apply topically 2 (two) times daily as needed. abd folds and thighs    Review of Systems  Unable to perform ROS: Dementia    Filed Vitals:   06/05/15 0834  BP: 114/77  Pulse: 84  Temp: 98.4 F (36.9 C)  TempSrc: Oral  Resp: 20  Height: 4\' 10"  (1.473 m)  Weight: 133 lb 3.2 oz (60.419 kg)   Body mass index is 27.85 kg/(m^2).  Physical Exam  Constitutional: She appears well-developed.  Frail appearing, lying in bed resting but easily awakened  HENT:  Mouth/Throat: Oropharynx is clear and moist. No oropharyngeal exudate.  Eyes: Pupils are equal, round, and reactive to light. No scleral icterus.  Neck: Neck  supple. Carotid bruit is not present. No tracheal deviation present.  Cardiovascular: Normal rate, regular rhythm and intact distal pulses.  Exam reveals no gallop and no friction rub.   Murmur (1/6 SEM) heard. No LE edema b/l. no calf TTP.   Pulmonary/Chest: Effort normal and breath sounds normal. No stridor. No respiratory distress. She has no wheezes. She has no rales.  Abdominal: Soft. Bowel sounds are normal. She exhibits no distension and no mass. There is no hepatomegaly. There is no tenderness. There is no rebound and no guarding.  Musculoskeletal: She exhibits edema and tenderness.  Lymphadenopathy:    She has no cervical adenopathy.  Neurological: She is alert.  Skin: Skin is warm and dry. No rash noted.  Psychiatric: Her behavior is normal. Her mood appears anxious.     Labs reviewed: Nursing Home on 06/05/2015  Component Date Value Ref Range Status  . Glucose 03/22/2015 77   Final  . BUN 03/22/2015 10  4 - 21 mg/dL Final  . Creatinine 08/65/784601/25/2017 0.4* 0.5 - 1.1 mg/dL Final  . Potassium 96/29/528401/25/2017 3.4  3.4 - 5.3 mmol/L Final  . Sodium 03/22/2015 138  137 - 147 mmol/L Final  . Alkaline Phosphatase 03/22/2015 54  25 - 125 U/L Final  . ALT 03/22/2015 13  7 - 35 U/L Final  . AST 03/22/2015 9* 13 - 35 U/L Final  . Bilirubin, Total 03/22/2015 0.8   Final    No results found.   Assessment/Plan   ICD-9-CM ICD-10-CM   1. Severe episode of recurrent major depressive disorder, with psychotic features (HCC) 296.34 F33.3   2. Dementia with behavioral disturbance 294.21 F03.91   3. Hyperlipidemia LDL goal <130 272.4 E78.5   4. PVD (peripheral vascular disease) (HCC) 443.9 I73.9   5. CKD (chronic kidney disease) stage 2, GFR 60-89 ml/min 585.2 N18.2    Check CMP and lipid panel  [facility] NP to follow  Pt is medically stable on current tx plan. Continue current medications as ordered. PT/OT/ST as indicated. Will follow  Parilee Hally S. Ancil Linseyarter, D. O., F. A. C. O. I.  [facility] [address] [place], [place] [address] [phone] Cell (Monday-Friday 8 AM - 5 PM) [phone] After 5 PM and follow prompts

## 2015-06-06 LAB — LIPID PANEL
Cholesterol: 215 mg/dL — AB (ref 0–200)
HDL: 48 mg/dL (ref 35–70)
LDL Cholesterol: 154 mg/dL
TRIGLYCERIDES: 65 mg/dL (ref 40–160)

## 2015-06-06 LAB — BASIC METABOLIC PANEL
BUN: 12 mg/dL (ref 4–21)
CREATININE: 0.5 mg/dL (ref 0.5–1.1)
Glucose: 85 mg/dL
Potassium: 4.3 mmol/L (ref 3.4–5.3)
Sodium: 139 mmol/L (ref 137–147)

## 2015-06-06 LAB — HEPATIC FUNCTION PANEL
ALK PHOS: 56 U/L (ref 25–125)
ALT: 7 U/L (ref 7–35)
AST: 11 U/L — AB (ref 13–35)
BILIRUBIN, TOTAL: 0.4 mg/dL

## 2015-07-03 ENCOUNTER — Encounter: Payer: Self-pay | Admitting: Internal Medicine

## 2015-07-03 ENCOUNTER — Non-Acute Institutional Stay (SKILLED_NURSING_FACILITY): Payer: Medicare Other | Admitting: Internal Medicine

## 2015-07-03 DIAGNOSIS — F29 Unspecified psychosis not due to a substance or known physiological condition: Secondary | ICD-10-CM | POA: Diagnosis not present

## 2015-07-03 DIAGNOSIS — F333 Major depressive disorder, recurrent, severe with psychotic symptoms: Secondary | ICD-10-CM

## 2015-07-03 DIAGNOSIS — F03918 Unspecified dementia, unspecified severity, with other behavioral disturbance: Secondary | ICD-10-CM

## 2015-07-03 DIAGNOSIS — E785 Hyperlipidemia, unspecified: Secondary | ICD-10-CM | POA: Diagnosis not present

## 2015-07-03 DIAGNOSIS — F0391 Unspecified dementia with behavioral disturbance: Secondary | ICD-10-CM

## 2015-07-03 DIAGNOSIS — L259 Unspecified contact dermatitis, unspecified cause: Secondary | ICD-10-CM | POA: Diagnosis not present

## 2015-07-03 NOTE — Progress Notes (Signed)
DATE: 07/03/15  Location:  [facility]  Nursing Home Room Number: 103 A Place of Service: SNF (31)   Extended Emergency Contact Information Primary Emergency Contact: Gagnon,Anita Address: [address]          [name] [address] [place] Mobile Phone: [phone] Relation: None  Advanced Directive information Does patient have an advance directive?: Yes, Type of Advance Directive: Out of facility DNR (pink MOST or yellow form), Does patient want to make changes to advanced directive?: No - Patient declined  Chief Complaint  Patient presents with  . Medical Management of Chronic Issues    Routine Visit; [facility]     HPI:  82 yo female long term resident seen today for f/u. She  c/o itchy rash on arm today. She is a poor historian due to dementia/psych issues. Hx obtained from chart. No nursing issues. No falls  MDD with psychosis/dementia - mood stable on lamictal and risperdal. Cognitively declining. Albumin 3.1  Arthritis - stable on Tylenol prn  Thyroid - currently untreated. TFTs monitored. She is asymptomatic  CKD - Cr 0.4   Elevated Nh3 level - stable. No longer takes lactulose  Hyperlipidemia - takes fish oil. LDL 194. No statin due to frail state  She takes Vit D also.    Past Medical History  Diagnosis Date  . Pneumonia   . Arthritis   . Depression   . Hyperlipidemia   . Major depressive disorder (HCC)   . Thyroid disease     hypothryriodism  . Falls     Past Surgical History  Procedure Laterality Date  . Appendectomy      No care team member to display  Social History   Social History  . Marital Status: Widowed    Spouse Name: N/A  . Number of Children: N/A  . Years of Education: N/A   Occupational History  . Not on file.   Social History Main Topics  . Smoking status: Never Smoker   . Smokeless tobacco: Not on file  . Alcohol Use: No  . Drug Use: Yes  . Sexual Activity: No   Other Topics Concern    . Not on file   Social History Narrative     reports that she has never smoked. She does not have any smokeless tobacco history on file. She reports that she uses illicit drugs. She reports that she does not drink alcohol.  Immunization History  Administered Date(s) Administered  . Influenza-Unspecified 11/25/2012, 12/07/2014    Allergies  Allergen Reactions  . Penicillins Anaphylaxis    Medications: Patient's Medications  New Prescriptions   No medications on file  Previous Medications   ACETAMINOPHEN (TYLENOL) 325 MG TABLET    Take 2 tablets (650 mg total) by mouth every 6 (six) hours as needed.   CHOLECALCIFEROL (VITAMIN D3) 50000 UNITS CAPS    Take 1 capsule by mouth every 30 (thirty) days.   LAMOTRIGINE (LAMICTAL) 100 MG TABLET    Take by mouth.   OMEGA-3 FATTY ACIDS (FISH OIL) 1000 MG CPDR    Take 1,000 mg by mouth 2 (two) times daily.   RISPERIDONE (RISPERDAL) 0.25 MG TABLET    Take by mouth.  Modified Medications   No medications on file  Discontinued Medications   No medications on file    Review of Systems  Unable to perform ROS: Dementia    Filed Vitals:   07/03/15 1409  BP: 114/77  Pulse: 71  Temp: 97.1  F (36.2 C)  TempSrc: Oral  Resp: 18  Height: 4\' 10"  (1.473 m)  Weight: 128 lb 6.4 oz (58.242 kg)   Body mass index is 26.84 kg/(m^2).  Physical Exam  Constitutional: She appears well-developed.  Frail appearing, lying in bed resting but easily awakened  HENT:  Mouth/Throat: Oropharynx is clear and moist. No oropharyngeal exudate.  Eyes: Pupils are equal, round, and reactive to light. No scleral icterus.  Neck: Neck supple. Carotid bruit is not present. No tracheal deviation present.  Cardiovascular: Normal rate, regular rhythm and intact distal pulses.  Exam reveals no gallop and no friction rub.   Murmur (1/6 SEM) heard. No LE edema b/l. no calf TTP.   Pulmonary/Chest: Effort normal and breath sounds normal. No stridor. No respiratory  distress. She has no wheezes. She has no rales.  Abdominal: Soft. Bowel sounds are normal. She exhibits no distension and no mass. There is no hepatomegaly. There is no tenderness. There is no rebound and no guarding.  Musculoskeletal: She exhibits edema and tenderness.  Lymphadenopathy:    She has no cervical adenopathy.  Neurological: She is alert.  Skin: Skin is warm and dry. Rash (left arm patchy red rash, no vesicles. no secondary signs of infection) noted.  Psychiatric: Her behavior is normal. Her mood appears anxious.     Labs reviewed: Nursing Home on 07/03/2015  Component Date Value Ref Range Status  . Glucose 06/06/2015 85   Final  . BUN 06/06/2015 12  4 - 21 mg/dL Final  . Creatinine 81/19/1478 0.5  0.5 - 1.1 mg/dL Final  . Potassium 29/56/2130 4.3  3.4 - 5.3 mmol/L Final  . Sodium 06/06/2015 139  137 - 147 mmol/L Final  . Triglycerides 06/06/2015 65  40 - 160 mg/dL Final  . Cholesterol 86/57/8469 215* 0 - 200 mg/dL Final  . HDL 62/95/2841 48  35 - 70 mg/dL Final  . LDL Cholesterol 06/06/2015 154   Final  . Alkaline Phosphatase 06/06/2015 56  25 - 125 U/L Final  . ALT 06/06/2015 7  7 - 35 U/L Final  . AST 06/06/2015 11* 13 - 35 U/L Final  . Bilirubin, Total 06/06/2015 0.4   Final  Nursing Home on 06/05/2015  Component Date Value Ref Range Status  . Glucose 03/22/2015 77   Final  . BUN 03/22/2015 10  4 - 21 mg/dL Final  . Creatinine 32/44/0102 0.4* 0.5 - 1.1 mg/dL Final  . Potassium 72/53/6644 3.4  3.4 - 5.3 mmol/L Final  . Sodium 03/22/2015 138  137 - 147 mmol/L Final  . Alkaline Phosphatase 03/22/2015 54  25 - 125 U/L Final  . ALT 03/22/2015 13  7 - 35 U/L Final  . AST 03/22/2015 9* 13 - 35 U/L Final  . Bilirubin, Total 03/22/2015 0.8   Final  . Hemoglobin 03/17/2015 12.7  12.0 - 16.0 g/dL Final  . HCT 03/47/4259 38  36 - 46 % Final  . Platelets 03/17/2015 344  150 - 399 K/L Final  . WBC 03/17/2015 7.3   Final  . Glucose 03/17/2015 78   Final  . BUN 03/17/2015  12  4 - 21 mg/dL Final  . Creatinine 56/38/7564 0.5  0.5 - 1.1 mg/dL Final  . Potassium 33/29/5188 6.8* 3.4 - 5.3 mmol/L Final  . Sodium 03/17/2015 137  137 - 147 mmol/L Final  . Triglycerides 03/17/2015 77  40 - 160 mg/dL Final  . Cholesterol 41/66/0630 247* 0 - 200 mg/dL Final  . HDL 16/02/930 38  35 - 70 mg/dL Final  . LDL Cholesterol 03/17/2015 194   Final  . Alkaline Phosphatase 03/17/2015 49  25 - 125 U/L Final  . ALT 03/17/2015 13  7 - 35 U/L Final  . AST 03/17/2015 53* 13 - 35 U/L Final  . Bilirubin, Total 03/17/2015 0.4   Final  . Alkaline Phosphatase 03/22/2015 54  25 - 125 U/L Final  . ALT 03/22/2015 9  7 - 35 U/L Final  . AST 03/22/2015 13  13 - 35 U/L Final  . Bilirubin, Total 03/22/2015 0.8   Final    No results found.   Assessment/Plan   ICD-9-CM ICD-10-CM   1. Contact dermatitis 692.9 L25.9   2. Dementia with behavioral disturbance 294.21 F03.91   3. Psychosis, unspecified psychosis type 298.9 F29   4. Severe episode of recurrent major depressive disorder, with psychotic features (HCC) 296.34 F33.3   5. Hyperlipidemia LDL goal <130 272.4 E78.5     Start triamcinolone cream 0.1% to rash BID x 1 week for rash prn flare  Cont other meds as ordered  PT/OT/ST as indicated  Nutritional supplements as ordered  [facility] provider to follow  Will follow  Tanairi Cypert S. Ancil Linseyarter, D. O., F. A. C. O. I.  [facility] [address] [place], [place] [address] [phone] Cell (Monday-Friday 8 AM - 5 PM) [phone] After 5 PM and follow prompts

## 2015-07-31 ENCOUNTER — Encounter: Payer: Self-pay | Admitting: Internal Medicine

## 2015-07-31 ENCOUNTER — Non-Acute Institutional Stay (SKILLED_NURSING_FACILITY): Payer: Medicare Other | Admitting: Internal Medicine

## 2015-07-31 DIAGNOSIS — F0391 Unspecified dementia with behavioral disturbance: Secondary | ICD-10-CM | POA: Diagnosis not present

## 2015-07-31 DIAGNOSIS — L259 Unspecified contact dermatitis, unspecified cause: Secondary | ICD-10-CM

## 2015-07-31 DIAGNOSIS — F333 Major depressive disorder, recurrent, severe with psychotic symptoms: Secondary | ICD-10-CM | POA: Diagnosis not present

## 2015-07-31 DIAGNOSIS — F29 Unspecified psychosis not due to a substance or known physiological condition: Secondary | ICD-10-CM

## 2015-07-31 DIAGNOSIS — F03918 Unspecified dementia, unspecified severity, with other behavioral disturbance: Secondary | ICD-10-CM

## 2015-07-31 DIAGNOSIS — E785 Hyperlipidemia, unspecified: Secondary | ICD-10-CM

## 2015-07-31 NOTE — Progress Notes (Signed)
DATE: 07/31/15  Location:  [facility] Room Number: 103 A Place of Service: SNF (31)   Extended Emergency Contact Information Primary Emergency Contact: Holly Rivera,Holly Rivera Address: [address]          [place] [address] [name] Mobile Phone: [phone] Relation: None  Advanced Directive information Does patient have an advance directive?: Yes, Type of Advance Directive: Out of facility DNR (pink MOST or yellow form), Does patient want to make changes to advanced directive?: No - Patient declined  Chief Complaint  Patient presents with  . Medical Management of Chronic Issues    Routine Visit/ [facility]    HPI:  82 yo female long term resident seen today for f/u. She  c/o itchy rash on chest today. She is a poor historian due to dementia/psych issues. Hx obtained from chart. No nursing issues. No falls  MDD with psychosis/dementia - mood stable on lamictal and risperdal. Cognitively declining. Albumin 3.1  Arthritis - stable on Tylenol prn  Thyroid - currently untreated. TFTs monitored. She is asymptomatic  CKD - Cr 0.4   Elevated Nh3 level - stable. No longer takes lactulose  Hyperlipidemia - takes fish oil. LDL 194. No statin due to frail state  She takes Vit D also.   Past Medical History  Diagnosis Date  . Pneumonia   . Arthritis   . Depression   . Hyperlipidemia   . Major depressive disorder ([place])   . Thyroid disease     hypothryriodism  . Falls     Past Surgical History  Procedure Laterality Date  . Appendectomy      No care team member to display  Social History   Social History  . Marital Status: Widowed    Spouse Name: N/A  . Number of Children: N/A  . Years of Education: N/A   Occupational History  . Not on file.   Social History Main Topics  . Smoking status: Never Smoker   . Smokeless tobacco: Not on file  . Alcohol Use: No  . Drug Use: Yes  . Sexual Activity: No   Other Topics Concern  .  Not on file   Social History Narrative     reports that she has never smoked. She does not have any smokeless tobacco history on file. She reports that she uses illicit drugs. She reports that she does not drink alcohol.  No family history on file. Unable to obtain due to pt's dementia No family status information on file. Unable to obtain due to pt's dementia    Immunization History  Administered Date(s) Administered  . Influenza-Unspecified 11/25/2012, 12/07/2014    Allergies  Allergen Reactions  . Penicillins Anaphylaxis    Medications: Patient's Medications  New Prescriptions   No medications on file  Previous Medications   ACETAMINOPHEN (TYLENOL) 325 MG TABLET    Take 2 tablets (650 mg total) by mouth every 6 (six) hours as needed.   CHOLECALCIFEROL (VITAMIN D3) 50000 UNITS CAPS    Take 1 capsule by mouth every 30 (thirty) days.   LAMOTRIGINE (LAMICTAL) 100 MG TABLET    Take 100 mg by mouth daily.    OMEGA-3 FATTY ACIDS (FISH OIL) 1000 MG CPDR    Take 1,000 mg by mouth 2 (two) times daily.   RISPERIDONE (RISPERDAL) 0.25 MG TABLET    Take 0.25 mg by mouth at bedtime.   Modified Medications   No medications on file  Discontinued Medications  No medications on file    Review of Systems  Unable to perform ROS: Dementia    Filed Vitals:   07/31/15 1246  BP: 114/77  Pulse: 72  Temp: 97.8 F (36.6 C)  TempSrc: Oral  Resp: 18  Height: '4\' 10"'$  (1.473 m)  Weight: 129 lb 3.2 oz (58.605 kg)   Body mass index is 27.01 kg/(m^2).  Physical Exam  Constitutional: She appears well-developed.  Frail appearing, lying in bed resting but easily awakened  HENT:  Mouth/Throat: Oropharynx is clear and moist. No oropharyngeal exudate.  Eyes: Pupils are equal, round, and reactive to light. No scleral icterus.  Neck: Neck supple. Carotid bruit is not present. No tracheal deviation present.  Cardiovascular: Normal rate, regular rhythm and intact distal pulses.  Exam reveals no  gallop and no friction rub.   Murmur (1/6 SEM) heard. No LE edema b/l. no calf TTP.   Pulmonary/Chest: Effort normal and breath sounds normal. No stridor. No respiratory distress. She has no wheezes. She has no rales.  Abdominal: Soft. Bowel sounds are normal. She exhibits no distension and no mass. There is no hepatomegaly. There is no tenderness. There is no rebound and no guarding.  Musculoskeletal: She exhibits edema and tenderness.  Lymphadenopathy:    She has no cervical adenopathy.  Neurological: She is alert.  Skin: Skin is warm and dry. Rash (anterior CW patchy red rash, no vesicles. no secondary signs of infection) noted.  Psychiatric: She is withdrawn. She exhibits a depressed mood.     Labs reviewed: Nursing Home on 07/03/2015  Component Date Value Ref Range Status  . Glucose 06/06/2015 85   Final  . BUN 06/06/2015 12  4 - 21 mg/dL Final  . Creatinine 06/06/2015 0.5  0.5 - 1.1 mg/dL Final  . Potassium 06/06/2015 4.3  3.4 - 5.3 mmol/L Final  . Sodium 06/06/2015 139  137 - 147 mmol/L Final  . Triglycerides 06/06/2015 65  40 - 160 mg/dL Final  . Cholesterol 06/06/2015 215* 0 - 200 mg/dL Final  . HDL 06/06/2015 48  35 - 70 mg/dL Final  . LDL Cholesterol 06/06/2015 154   Final  . Alkaline Phosphatase 06/06/2015 56  25 - 125 U/L Final  . ALT 06/06/2015 7  7 - 35 U/L Final  . AST 06/06/2015 11* 13 - 35 U/L Final  . Bilirubin, Total 06/06/2015 0.4   Final  Nursing Home on 06/05/2015  Component Date Value Ref Range Status  . Glucose 03/22/2015 77   Final  . BUN 03/22/2015 10  4 - 21 mg/dL Final  . Creatinine 03/22/2015 0.4* 0.5 - 1.1 mg/dL Final  . Potassium 03/22/2015 3.4  3.4 - 5.3 mmol/L Final  . Sodium 03/22/2015 138  137 - 147 mmol/L Final  . Alkaline Phosphatase 03/22/2015 54  25 - 125 U/L Final  . ALT 03/22/2015 13  7 - 35 U/L Final  . AST 03/22/2015 9* 13 - 35 U/L Final  . Bilirubin, Total 03/22/2015 0.8   Final  . Hemoglobin 03/17/2015 12.7  12.0 - 16.0 g/dL Final   . HCT 03/17/2015 38  36 - 46 % Final  . Platelets 03/17/2015 344  150 - 399 K/L Final  . WBC 03/17/2015 7.3   Final  . Glucose 03/17/2015 78   Final  . BUN 03/17/2015 12  4 - 21 mg/dL Final  . Creatinine 03/17/2015 0.5  0.5 - 1.1 mg/dL Final  . Potassium 03/17/2015 6.8* 3.4 - 5.3 mmol/L Final  . Sodium  03/17/2015 137  137 - 147 mmol/L Final  . Triglycerides 03/17/2015 77  40 - 160 mg/dL Final  . Cholesterol 03/17/2015 247* 0 - 200 mg/dL Final  . HDL 03/17/2015 38  35 - 70 mg/dL Final  . LDL Cholesterol 03/17/2015 194   Final  . Alkaline Phosphatase 03/17/2015 49  25 - 125 U/L Final  . ALT 03/17/2015 13  7 - 35 U/L Final  . AST 03/17/2015 53* 13 - 35 U/L Final  . Bilirubin, Total 03/17/2015 0.4   Final  . Alkaline Phosphatase 03/22/2015 54  25 - 125 U/L Final  . ALT 03/22/2015 9  7 - 35 U/L Final  . AST 03/22/2015 13  13 - 35 U/L Final  . Bilirubin, Total 03/22/2015 0.8   Final    No results found.   Assessment/Plan   ICD-9-CM ICD-10-CM   1. Dementia with behavioral disturbance 294.21 F03.91   2. Severe episode of recurrent major depressive disorder, with psychotic features ([place]) 296.34 F33.3   3. Psychosis, unspecified psychosis type 298.9 F29   4. Hyperlipidemia LDL goal <130 - not met; unable to tolerate statin at this time 272.4 E78.5   5. Contact dermatitis - stable 692.9 L25.9    [facility] NP to follow  Fall precautions  Cont moisturizing lotion  Pt is medically stable on current tx plan. Continue current medications as ordered. PT/OT/ST as indicated. Will follow   Jary Louvier S. Perlie Gold  [facility] [address] [place], [place] [address] [phone] Cell (Monday-Friday 8 AM - 5 PM) [phone] After 5 PM and follow prompts

## 2015-08-31 ENCOUNTER — Non-Acute Institutional Stay (SKILLED_NURSING_FACILITY): Payer: Medicare Other | Admitting: Internal Medicine

## 2015-08-31 DIAGNOSIS — F0391 Unspecified dementia with behavioral disturbance: Secondary | ICD-10-CM | POA: Diagnosis not present

## 2015-08-31 DIAGNOSIS — E785 Hyperlipidemia, unspecified: Secondary | ICD-10-CM

## 2015-08-31 DIAGNOSIS — F29 Unspecified psychosis not due to a substance or known physiological condition: Secondary | ICD-10-CM | POA: Diagnosis not present

## 2015-08-31 DIAGNOSIS — Z79899 Other long term (current) drug therapy: Secondary | ICD-10-CM

## 2015-08-31 DIAGNOSIS — F03918 Unspecified dementia, unspecified severity, with other behavioral disturbance: Secondary | ICD-10-CM

## 2015-08-31 DIAGNOSIS — F333 Major depressive disorder, recurrent, severe with psychotic symptoms: Secondary | ICD-10-CM

## 2015-08-31 NOTE — Progress Notes (Signed)
Patient ID: Holly Rivera, female   DOB: [birth year], 82 y.o.   MRN: [id number]    OPENED IN ERROR/DISREGARD DATE: 08/31/15  Location:  Nursing Home Location: [facility]  Nursing Home Room Number: 103 A Place of Service: SNF (31)   Extended Emergency Contact Information Primary Emergency Contact: Gagnon,Anita [address]          [place] [id number] [place] Mobile Phone: [phone] Relation: None  Advanced Directive information Does patient have an advance directive?: Yes, Type of Advance Directive: Out of facility DNR (pink MOST or yellow form), Does patient want to make changes to advanced directive?: No - Patient declined   Chief Complaint  Patient presents with  . Medical Management of Chronic Issues    Routine Visit; [facility]    HPI:  82 yo female long term resident seen today for f/u.  She has no concerns today. She is a poor historian due to dementia/psych issues. Hx obtained from chart. No nursing issues. No falls  MDD with psychosis/dementia - mood stable on lamictal and risperdal. Cognitively declining. Albumin 3.1  Arthritis - stable on Tylenol prn  Thyroid - currently untreated. TFTs monitored. She is asymptomatic  CKD - Cr 0.4   Elevated Nh3 level - stable. No longer takes lactulose  Hyperlipidemia - takes fish oil. LDL 194. No statin due to frail state  She takes Vit D also.    Past Medical History  Diagnosis Date  . Pneumonia   . Arthritis   . Depression   . Hyperlipidemia   . Major depressive disorder ([facility])   . Thyroid disease     hypothryriodism  . Falls     Past Surgical History  Procedure Laterality Date  . Appendectomy      No care team member to display  Social History   Social History  . Marital Status: Widowed    Spouse Name: N/A  . Number of Children: N/A  . Years of Education: N/A   Occupational History  . Not on file.   Social History Main Topics  . Smoking status: Never Smoker     . Smokeless tobacco: Not on file  . Alcohol Use: No  . Drug Use: Yes  . Sexual Activity: No   Other Topics Concern  . Not on file   Social History Narrative     reports that she has never smoked. She does not have any smokeless tobacco history on file. She reports that she uses illicit drugs. She reports that she does not drink alcohol.  History reviewed. No pertinent family history. No family status information on file.    Immunization History  Administered Date(s) Administered  . Influenza-Unspecified 11/25/2012, 12/07/2014    Allergies  Allergen Reactions  . Penicillins Anaphylaxis    Medications: Patient's Medications  New Prescriptions   No medications on file  Previous Medications   ACETAMINOPHEN (TYLENOL) 325 MG TABLET    Take 2 tablets (650 mg total) by mouth every 6 (six) hours as needed.   CHOLECALCIFEROL (VITAMIN D3) 50000 UNITS CAPS    Take 1 capsule by mouth every 30 (thirty) days.   LAMOTRIGINE (LAMICTAL) 100 MG TABLET    Take 100 mg by mouth daily.    OMEGA-3 FATTY ACIDS (FISH OIL) 1000 MG CPDR    Take 1,000 mg by mouth 2 (two) times daily.   RISPERIDONE (RISPERDAL) 0.25 MG TABLET    Take 0.25 mg by mouth at bedtime.   Modified Medications  No medications on file  Discontinued Medications   No medications on file    Review of Systems  Unable to perform ROS: Dementia    Filed Vitals:   08/31/15 1000  BP: 114/77  Pulse: 84  Temp: 98.6 F (37 C)  TempSrc: Oral  Resp: 18  Height:  (1.473 m)  Weight: 130 lb 6.4 oz (59.149 kg)   Body mass index is 27.26 kg/(m^2).  Physical Exam  Constitutional: She appears well-developed.  Frail appearing, lying in bed resting but easily awakened  HENT:  Mouth/Throat: Oropharynx is clear and moist. No oropharyngeal exudate.  Eyes: Pupils are equal, round, and reactive to light. No scleral icterus.  Neck: Neck supple. Carotid bruit is not present. No tracheal deviation present.  Cardiovascular:  Normal rate, regular rhythm and intact distal pulses.  Exam reveals no gallop and no friction rub.   Murmur (1/6 SEM) heard. No LE edema b/l. no calf TTP.   Pulmonary/Chest: Effort normal and breath sounds normal. No stridor. No respiratory distress. She has no wheezes. She has no rales.  Abdominal: Soft. Bowel sounds are normal. She exhibits no distension and no mass. There is no hepatomegaly. There is no tenderness. There is no rebound and no guarding.  Musculoskeletal: She exhibits edema.  Lymphadenopathy:    She has no cervical adenopathy.  Neurological: She is alert.  Skin: Skin is warm and dry. No rash noted.  Psychiatric: She is withdrawn. She exhibits a depressed mood.     Labs reviewed: Nursing Home on 07/03/2015  Component Date Value Ref Range Status  . Glucose 06/06/2015 85   Final  . BUN 06/06/2015 12  4 - 21 mg/dL Final  . Creatinine 40/98/1191 0.5  0.5 - 1.1 mg/dL Final  . Potassium 47/82/9562 4.3  3.4 - 5.3 mmol/L Final  . Sodium 06/06/2015 139  137 - 147 mmol/L Final  . Triglycerides 06/06/2015 65  40 - 160 mg/dL Final  . Cholesterol 13/09/6576 215* 0 - 200 mg/dL Final  . HDL 46/96/2952 48  35 - 70 mg/dL Final  . LDL Cholesterol 06/06/2015 154   Final  . Alkaline Phosphatase 06/06/2015 56  25 - 125 U/L Final  . ALT 06/06/2015 7  7 - 35 U/L Final  . AST 06/06/2015 11* 13 - 35 U/L Final  . Bilirubin, Total 06/06/2015 0.4   Final  Nursing Home on 06/05/2015  Component Date Value Ref Range Status  . Glucose 03/22/2015 77   Final  . BUN 03/22/2015 10  4 - 21 mg/dL Final  . Creatinine 84/13/2440 0.4* 0.5 - 1.1 mg/dL Final  . Potassium 12/22/2534 3.4  3.4 - 5.3 mmol/L Final  . Sodium 03/22/2015 138  137 - 147 mmol/L Final  . Alkaline Phosphatase 03/22/2015 54  25 - 125 U/L Final  . ALT 03/22/2015 13  7 - 35 U/L Final  . AST 03/22/2015 9* 13 - 35 U/L Final  . Bilirubin, Total 03/22/2015 0.8   Final  . Hemoglobin 03/17/2015 12.7  12.0 - 16.0 g/dL Final  . HCT  64/40/3474 38  36 - 46 % Final  . Platelets 03/17/2015 344  150 - 399 K/L Final  . WBC 03/17/2015 7.3   Final  . Glucose 03/17/2015 78   Final  . BUN 03/17/2015 12  4 - 21 mg/dL Final  . Creatinine 25/95/6387 0.5  0.5 - 1.1 mg/dL Final  . Potassium 56/43/3295 6.8* 3.4 - 5.3 mmol/L Final  . Sodium 03/17/2015 137  137 -  147 mmol/L Final  . Triglycerides 03/17/2015 77  40 - 160 mg/dL Final  . Cholesterol 16/10/960401/20/2017 247* 0 - 200 mg/dL Final  . HDL 54/09/811901/20/2017 38  35 - 70 mg/dL Final  . LDL Cholesterol 03/17/2015 194   Final  . Alkaline Phosphatase 03/17/2015 49  25 - 125 U/L Final  . ALT 03/17/2015 13  7 - 35 U/L Final  . AST 03/17/2015 53* 13 - 35 U/L Final  . Bilirubin, Total 03/17/2015 0.4   Final  . Alkaline Phosphatase 03/22/2015 54  25 - 125 U/L Final  . ALT 03/22/2015 9  7 - 35 U/L Final  . AST 03/22/2015 13  13 - 35 U/L Final  . Bilirubin, Total 03/22/2015 0.8   Final    No results found.   Assessment/Plan    Monica S. Ancil Linseyarter, D. O., F. A. C. O. I.  [facility] [address] [place], [place] [address] [phone] Cell (Monday-Friday 8 AM - 5 PM) [phone] After 5 PM and follow prompts

## 2015-09-04 LAB — LIPID PANEL
Cholesterol: 191 mg/dL (ref 0–200)
HDL: 55 mg/dL (ref 35–70)
LDL CALC: 124 mg/dL
Triglycerides: 61 mg/dL (ref 40–160)

## 2015-09-04 LAB — CBC AND DIFFERENTIAL
HCT: 37 % (ref 36–46)
HEMATOCRIT: 37 % (ref 36–46)
HEMOGLOBIN: 12.3 g/dL (ref 12.0–16.0)
Hemoglobin: 12.3 g/dL (ref 12.0–16.0)
PLATELETS: 245 10*3/uL (ref 150–399)
Platelets: 245 10*3/uL (ref 150–399)
WBC: 7.1 10*3/mL
WBC: 7.1 10^3/mL

## 2015-09-04 LAB — TSH: TSH: 3.73 u[IU]/mL (ref 0.41–5.90)

## 2015-09-06 ENCOUNTER — Encounter: Payer: Self-pay | Admitting: Internal Medicine

## 2015-09-06 DIAGNOSIS — F29 Unspecified psychosis not due to a substance or known physiological condition: Secondary | ICD-10-CM | POA: Insufficient documentation

## 2015-09-06 DIAGNOSIS — Z79899 Other long term (current) drug therapy: Secondary | ICD-10-CM | POA: Insufficient documentation

## 2015-09-06 NOTE — Progress Notes (Signed)
Patient ID: Holly Rivera, female   DOB: [birth year], 82 y.o.   MRN: [id number]    DATE: 08/31/15  Location:  Nursing Home Location: [facility]  Nursing Home Room Number: 103 A Place of Service: SNF (31)   Extended Emergency Contact Information Primary Emergency Contact: Gagnon,Anita Address: [address]          [name] [address] [place] Mobile Phone: [phone] Relation: None  Advanced Directive information Does patient have an advance directive?: Yes, Type of Advance Directive: Out of facility DNR (pink MOST or yellow form), Does patient want to make changes to advanced directive?: No - Patient declinedDNR  Chief Complaint  Patient presents with  . Medical Management of Chronic Issues    [facility]    HPI:   82 yo female long term resident seen today for f/u. She has no concerns today. She is a poor historian due to dementia/psych issues. Hx obtained from chart. No nursing issues. No falls  MDD with psychosis/dementia - mood stable on lamictal and risperdal. Cognitively declining. Albumin 3.1. She has visual hallucinations and benefits from lamictal and risperdal  Arthritis - stable on Tylenol prn  Thyroid - currently untreated. TFTs monitored. She is asymptomatic  CKD - Cr 0.4   Elevated Nh3 level - stable. No longer takes lactulose  Hyperlipidemia - takes fish oil. LDL 194. No statin due to frail state  She takes Vit D also.   Past Medical History  Diagnosis Date  . Pneumonia   . Arthritis   . Depression   . Hyperlipidemia   . Major depressive disorder ([facility])   . Thyroid disease     hypothryriodism  . Falls     Past Surgical History  Procedure Laterality Date  . Appendectomy      No care team member to display  Social History   Social History  . Marital Status: Widowed    Spouse Name: N/A  . Number of Children: N/A  . Years of Education: N/A   Occupational History  . Not on file.   Social History Main Topics  .  Smoking status: Never Smoker   . Smokeless tobacco: Not on file  . Alcohol Use: No  . Drug Use: Yes  . Sexual Activity: No   Other Topics Concern  . Not on file   Social History Narrative     reports that she has never smoked. She does not have any smokeless tobacco history on file. She reports that she uses illicit drugs. She reports that she does not drink alcohol.  No family history on file. No family status information on file.    Immunization History  Administered Date(s) Administered  . Influenza-Unspecified 11/25/2012, 12/07/2014    Allergies  Allergen Reactions  . Penicillins Anaphylaxis    Medications: Patient's Medications  New Prescriptions   No medications on file  Previous Medications   ACETAMINOPHEN (TYLENOL) 325 MG TABLET    Take 2 tablets (650 mg total) by mouth every 6 (six) hours as needed.   CHOLECALCIFEROL (VITAMIN D3) 50000 UNITS CAPS    Take 1 capsule by mouth every 30 (thirty) days.   LAMOTRIGINE (LAMICTAL) 100 MG TABLET    Take 100 mg by mouth daily.    OMEGA-3 FATTY ACIDS (FISH OIL) 1000 MG CPDR    Take 1,000 mg by mouth 2 (two) times daily.   RISPERIDONE (RISPERDAL) 0.25 MG TABLET    Take 0.25 mg by mouth at bedtime.   Modified Medications  No medications on file  Discontinued Medications   No medications on file    Review of Systems   Unable to perform ROS: Dementia    Filed Vitals:   08/31/15 1546  BP: 114/77  Pulse: 84  Temp: 98.6 F (37 C)  TempSrc: Oral  Height:  (1.473 m)  Weight: 130 lb (58.968 kg)   Body mass index is 27.18 kg/(m^2).  Physical Exam Constitutional: She appears well-developed.  Frail appearing, lying in bed resting but easily awakened  HENT:  Mouth/Throat: Oropharynx is clear and moist. No oropharyngeal exudate.  Eyes: Pupils are equal, round, and reactive to light. No scleral icterus.  Neck: Neck supple. Carotid bruit is not present. No tracheal deviation present.  Cardiovascular: Normal rate,  regular rhythm and intact distal pulses. Exam reveals no gallop and no friction rub.  Murmur (1/6 SEM) heard. No LE edema b/l. no calf TTP.  Pulmonary/Chest: Effort normal and breath sounds normal. No stridor. No respiratory distress. She has no wheezes. She has no rales.  Abdominal: Soft. Bowel sounds are normal. She exhibits no distension and no mass. There is no hepatomegaly. There is no tenderness. There is no rebound and no guarding.  Musculoskeletal: She exhibits edema.  Lymphadenopathy:   She has no cervical adenopathy.  Neurological: She is alert.  Skin: Skin is warm and dry. No rash noted.  Psychiatric: She is withdrawn. She exhibits a depressed mood.     Labs reviewed: Nursing Home on 07/03/2015  Component Date Value Ref Range Status  . Glucose 06/06/2015 85   Final  . BUN 06/06/2015 12  4 - 21 mg/dL Final  . Creatinine 96/05/5407 0.5  0.5 - 1.1 mg/dL Final  . Potassium 81/19/1478 4.3  3.4 - 5.3 mmol/L Final  . Sodium 06/06/2015 139  137 - 147 mmol/L Final  . Triglycerides 06/06/2015 65  40 - 160 mg/dL Final  . Cholesterol 29/56/2130 215* 0 - 200 mg/dL Final  . HDL 86/57/8469 48  35 - 70 mg/dL Final  . LDL Cholesterol 06/06/2015 154   Final  . Alkaline Phosphatase 06/06/2015 56  25 - 125 U/L Final  . ALT 06/06/2015 7  7 - 35 U/L Final  . AST 06/06/2015 11* 13 - 35 U/L Final  . Bilirubin, Total 06/06/2015 0.4   Final  Nursing Home on 06/05/2015  Component Date Value Ref Range Status  . Glucose 03/22/2015 77   Final  . BUN 03/22/2015 10  4 - 21 mg/dL Final  . Creatinine 62/95/2841 0.4* 0.5 - 1.1 mg/dL Final  . Potassium 32/44/0102 3.4  3.4 - 5.3 mmol/L Final  . Sodium 03/22/2015 138  137 - 147 mmol/L Final  . Alkaline Phosphatase 03/22/2015 54  25 - 125 U/L Final  . ALT 03/22/2015 13  7 - 35 U/L Final  . AST 03/22/2015 9* 13 - 35 U/L Final  . Bilirubin, Total 03/22/2015 0.8   Final  . Hemoglobin 03/17/2015 12.7  12.0 - 16.0 g/dL Final  . HCT 72/53/6644 38  36 -  46 % Final  . Platelets 03/17/2015 344  150 - 399 K/L Final  . WBC 03/17/2015 7.3   Final  . Glucose 03/17/2015 78   Final  . BUN 03/17/2015 12  4 - 21 mg/dL Final  . Creatinine 03/47/4259 0.5  0.5 - 1.1 mg/dL Final  . Potassium 56/38/7564 6.8* 3.4 - 5.3 mmol/L Final  . Sodium 03/17/2015 137  137 - 147 mmol/L Final  . Triglycerides 03/17/2015 77  40 - 160 mg/dL Final  . Cholesterol 16/10/960401/20/2017 247* 0 - 200 mg/dL Final  . HDL 54/09/811901/20/2017 38  35 - 70 mg/dL Final  . LDL Cholesterol 03/17/2015 194   Final  . Alkaline Phosphatase 03/17/2015 49  25 - 125 U/L Final  . ALT 03/17/2015 13  7 - 35 U/L Final  . AST 03/17/2015 53* 13 - 35 U/L Final  . Bilirubin, Total 03/17/2015 0.4   Final  . Alkaline Phosphatase 03/22/2015 54  25 - 125 U/L Final  . ALT 03/22/2015 9  7 - 35 U/L Final  . AST 03/22/2015 13  13 - 35 U/L Final  . Bilirubin, Total 03/22/2015 0.8   Final    No results found.   Assessment/Plan   ICD-9-CM ICD-10-CM   1. Psychosis, unspecified psychosis type 298.9 F29   2. Dementia with behavioral disturbance 294.21 F03.91   3. Severe episode of recurrent major depressive disorder, with psychotic features ([facility]) 296.34 F33.3   4. Hyperlipidemia LDL goal <130 272.4 E78.5   5. High risk medication use V58.69 Z79.899      Check CMP, lipid panel, TSH, free T4  [facility] NP to follow  Continue current med as ordered  PT/OT/ST as indicated  Nutritional supplements as ordered  Will follow  Tommy Minichiello S. Ancil Linseyarter, D. O., F. A. C. O. I.  [facility] [address] [place], [place] [address] [phone] Cell (Monday-Friday 8 AM - 5 PM) [phone] After 5 PM and follow prompts

## 2015-09-28 ENCOUNTER — Encounter: Payer: Self-pay | Admitting: Internal Medicine

## 2015-09-28 ENCOUNTER — Non-Acute Institutional Stay (SKILLED_NURSING_FACILITY): Payer: Medicare Other | Admitting: Internal Medicine

## 2015-09-28 DIAGNOSIS — F29 Unspecified psychosis not due to a substance or known physiological condition: Secondary | ICD-10-CM | POA: Diagnosis not present

## 2015-09-28 DIAGNOSIS — F333 Major depressive disorder, recurrent, severe with psychotic symptoms: Secondary | ICD-10-CM

## 2015-09-28 DIAGNOSIS — M1710 Unilateral primary osteoarthritis, unspecified knee: Secondary | ICD-10-CM

## 2015-09-28 DIAGNOSIS — F0391 Unspecified dementia with behavioral disturbance: Secondary | ICD-10-CM | POA: Diagnosis not present

## 2015-09-28 DIAGNOSIS — M179 Osteoarthritis of knee, unspecified: Secondary | ICD-10-CM

## 2015-09-28 DIAGNOSIS — E785 Hyperlipidemia, unspecified: Secondary | ICD-10-CM | POA: Diagnosis not present

## 2015-09-28 DIAGNOSIS — F03918 Unspecified dementia, unspecified severity, with other behavioral disturbance: Secondary | ICD-10-CM

## 2015-09-28 DIAGNOSIS — IMO0002 Reserved for concepts with insufficient information to code with codable children: Secondary | ICD-10-CM

## 2015-09-28 NOTE — Progress Notes (Signed)
Patient ID: Holly Rivera, female   DOB: 13-Feb-1919, 82 y.o.   MRN: [id number]    DATE: 09/28/15  Location:    [facility] Room Number: 103 A Place of Service: SNF (31)   Extended Emergency Contact Information Primary Emergency Contact: Gagnon,Anita Address: [address]          [name] [address] [place] Mobile Phone: [phone] Relation: None  Advanced Directive information Does patient have an advance directive?: Yes, Type of Advance Directive: Out of facility DNR (pink MOST or yellow form), Does patient want to make changes to advanced directive?: No - Patient declined  Chief Complaint  Patient presents with  . Medical Management of Chronic Issues    [facility] Routine Visit    HPI:  82 yo female long term resident seen today for f/u. She has no concerns today. No falls. Sleeping and eating ok. No nursing issues. She is a poor historian due to dementia/psych d/o. Hx obtained from chart  MDD with psychosis/dementia - mood stable on lamictal and risperdal. Cognitively declining. Albumin 3.1. Weight is stable. She has visual hallucinations and benefits from lamictal and risperdal at current dose. Followed by psych services  Arthritis - stable on Tylenol prn  Thyroid - currently untreated. TFTs monitored. TSH 3.73. She is asymptomatic  CKD - Cr 0.4   Elevated Nh3 level - stable. No longer takes lactulose  Hyperlipidemia - takes fish oil. LDL 124; total cholesterol 191. No statin due to frail state  She takes Vit D also.    Past Medical History:  Diagnosis Date  . Arthritis   . Depression   . Falls   . Hyperlipidemia   . Major depressive disorder (HCC)   . Pneumonia   . Thyroid disease    hypothryriodism    Past Surgical History:  Procedure Laterality Date  . APPENDECTOMY      No care team member to display  Social History   Social History  . Marital status: Widowed    Spouse name: N/A  . Number of children: N/A  . Years  of education: N/A   Occupational History  . Not on file.   Social History Main Topics  . Smoking status: Never Smoker  . Smokeless tobacco: Never Used  . Alcohol use No  . Drug use:   . Sexual activity: No   Other Topics Concern  . Not on file   Social History Narrative  . No narrative on file     reports that she has never smoked. She has never used smokeless tobacco. She reports that she uses drugs. She reports that she does not drink alcohol.  History reviewed. No pertinent family history. unable to obtain due to dementia/psych d/o No family status information on file.    Immunization History  Administered Date(s) Administered  . Influenza-Unspecified 11/25/2012, 12/07/2014    Allergies  Allergen Reactions  . Penicillins Anaphylaxis    Medications: Patient's Medications  New Prescriptions   No medications on file  Previous Medications   ACETAMINOPHEN (TYLENOL) 325 MG TABLET    Take 2 tablets (650 mg total) by mouth every 6 (six) hours as needed.   CHOLECALCIFEROL (VITAMIN D3) 50000 UNITS CAPS    Take 1 capsule by mouth every 30 (thirty) days.   LAMOTRIGINE (LAMICTAL) 100 MG TABLET    Take 100 mg by mouth daily.    OMEGA-3 FATTY ACIDS (FISH OIL) 1000 MG CPDR    Take 1,000 mg by mouth 2 (two)  times daily.   RISPERIDONE (RISPERDAL) 0.25 MG TABLET    Take 0.25 mg by mouth at bedtime.   Modified Medications   No medications on file  Discontinued Medications   No medications on file    Review of Systems  Unable to perform ROS: Dementia    Vitals:   09/28/15 1204  BP: 123/79  Pulse: 76  Resp: 18  Temp: 98.2 F (36.8 C)  TempSrc: Oral  Weight: 130 lb 6.4 oz (59.1 kg)  Height: 4\' 10"  (1.473 m)   Body mass index is 27.25 kg/m.  Physical Exam  Constitutional: She appears well-developed.  Frail appearing, lying in bed resting but easily awakened. She is completely naked but is under her bed sheets. She is incontinent of urine   HENT:  Mouth/Throat:  Oropharynx is clear and moist. No oropharyngeal exudate.  Eyes: Pupils are equal, round, and reactive to light. No scleral icterus.  Neck: Neck supple. Carotid bruit is not present. No tracheal deviation present.  Cardiovascular: Normal rate, regular rhythm and intact distal pulses.  Exam reveals no gallop and no friction rub.   Murmur (1/6 SEM) heard. No LE edema b/l. no calf TTP.   Pulmonary/Chest: Effort normal and breath sounds normal. No stridor. No respiratory distress. She has no wheezes. She has no rales.  Abdominal: Soft. Bowel sounds are normal. She exhibits no distension and no mass. There is no hepatomegaly. There is no tenderness. There is no rebound and no guarding.  Musculoskeletal: She exhibits edema and tenderness.  Small and large joint deformities  Lymphadenopathy:    She has no cervical adenopathy.  Neurological: She is alert.  Skin: Skin is warm and dry. No rash (but has excoriations on upper extremities) noted.  Psychiatric: She is withdrawn. She exhibits a depressed mood.     Labs reviewed: Nursing Home on 09/28/2015  Component Date Value Ref Range Status  . Hemoglobin 09/04/2015 12.3  12.0 - 16.0 g/dL Final  . HCT 45/85/9292 37  36 - 46 % Final  . Platelets 09/04/2015 245  150 - 399 K/L Final  . WBC 09/04/2015 7.1  10^3/mL Final  . Triglycerides 09/04/2015 61  40 - 160 mg/dL Final  . Cholesterol 44/62/8638 191  0 - 200 mg/dL Final  . HDL 17/71/1657 55  35 - 70 mg/dL Final  . LDL Cholesterol 09/04/2015 124  mg/dL Final  . TSH 90/38/3338 3.73  0.41 - 5.90 uIU/mL Final  Nursing Home on 07/03/2015  Component Date Value Ref Range Status  . Glucose 06/06/2015 85  mg/dL Final  . BUN 32/91/9166 12  4 - 21 mg/dL Final  . Creatinine 06/00/4599 0.5  0.5 - 1.1 mg/dL Final  . Potassium 77/41/4239 4.3  3.4 - 5.3 mmol/L Final  . Sodium 06/06/2015 139  137 - 147 mmol/L Final  . Triglycerides 06/06/2015 65  40 - 160 mg/dL Final  . Cholesterol 53/20/2334 215* 0 - 200  mg/dL Final  . HDL 35/68/6168 48  35 - 70 mg/dL Final  . LDL Cholesterol 06/06/2015 154  mg/dL Final  . Alkaline Phosphatase 06/06/2015 56  25 - 125 U/L Final  . ALT 06/06/2015 7  7 - 35 U/L Final  . AST 06/06/2015 11* 13 - 35 U/L Final  . Bilirubin, Total 06/06/2015 0.4  mg/dL Final    No results found.   Assessment/Plan   ICD-9-CM ICD-10-CM   1. Dementia with behavioral disturbance 294.21 F03.91   2. Psychosis, unspecified psychosis type - controlled 298.9 F29  3. Severe episode of recurrent major depressive disorder, with psychotic features (HCC) - stable 296.34 F33.3   4. Hyperlipidemia LDL goal <130 - controlled 272.4 E78.5   5. Osteoarthrosis, unspecified whether generalized or localized, involving lower leg - pain controlled 715.96 M17.9    [facility] NP to follow  Cont current meds as ordered  Psych services to follow  PT/OT/ST as indicated  Will follow   Tanay Misuraca S. Ancil Linsey  [facility] [address] [place], [place] [address] [phone] Cell (Monday-Friday 8 AM - 5 PM) [phone] After 5 PM and follow prompts

## 2015-11-02 ENCOUNTER — Encounter: Payer: Self-pay | Admitting: Internal Medicine

## 2015-11-02 ENCOUNTER — Non-Acute Institutional Stay (SKILLED_NURSING_FACILITY): Payer: Medicare Other | Admitting: Internal Medicine

## 2015-11-02 DIAGNOSIS — F0391 Unspecified dementia with behavioral disturbance: Secondary | ICD-10-CM | POA: Diagnosis not present

## 2015-11-02 DIAGNOSIS — F03918 Unspecified dementia, unspecified severity, with other behavioral disturbance: Secondary | ICD-10-CM

## 2015-11-02 NOTE — Assessment & Plan Note (Addendum)
She is noncompliant with wearing a diaper resulting in recurrent bedwetting Recurrent acting out with reported cursing Psychiatry evaluation completed 10/13/2015 recommended discontinuation of Risperdal. That was not implemented. Trial of buspirone 7.5 mg twice a day

## 2015-11-02 NOTE — Progress Notes (Signed)
   [facility] Room: [address] Marga MelnickWilliam Kellsie Grindle [address] [place] [address]  Chief Complaint  Patient presents with  . Medical Management of Chronic Issues    Medical Management of Chronic Issues    Allergies  Allergen Reactions  . Penicillins Anaphylaxis    This is a nursing facility follow up of chronic medical diagnoses  Interim medical record and care since [facility] visit was updated with review of diagnostic studies and change in clinical status since last visit were documented.  HPI:The patient has a diagnosis of major depressive disorder with psychotic features, dyslipidemia, and thyroid disease. She is on Lamictal and Risperdal. Lipids are current, LDL is mildly elevated at 578124. There is no past history of coronary artery disease. She does have history of peripheral vascular disease.Appropriately she's not on a statin. TSH is therapeutic at 3.73 w/o thyroid replacement. According to nursing staff she is intermittently combative and cursing. She's apparently estranged from her daughter. Psychiatric evaluation was completed 10/13/15 and that note was reviewed. Discontinuation of Risperdal was recommended because of her advanced age. That was not implemented. Lamictal was continued She has a chronic dermatitis over the buttocks as she will not keep on the diaper and repeatedly wets the bed. Special cream has not been of benefit in treating the dermatitis because of her noncompliance. According to nursing staff she is essentially wheelchair or bedbound. She can and pivot minimally.  Review of systems: Dementia invalidated responses. She was unable to give me the month or the day of the month. She did state it was 2017 .Ability to name year correctly is unusual according to the nurses. She is unable to name the president.   Physical exam:  Pertinent or positive findings: She is nude,lying in left decubitus  position under a bed pad. Bed  was urine soaked  She's very agitated and complaining of discomfort in the lumbosacral area. Pupils are pinpoint. Dental hygiene is miserable with erosions to and below the gumline. She has scattered minor rales without increased work of breathing Abdomen is protuberant. Pulses are decreased. Interosseous muscle wasting of the hands is present. Present are flexion contractures of the toes. Calves are atrophic.  She has evidence of healed excoriations and a chronic hyperpigmented bland rash over the buttocks area, particularly on the left. Hemorrhoidal tags are present. No lumbosacral area decubitus noted .  General appearance:Adequately nourished; no acute distress , increased work of breathing is present.   Lymphatic: No lymphadenopathy about the head, neck, axilla . Eyes: No conjunctival inflammation or lid edema is present. There is no scleral icterus. Ears:  External ear exam shows no significant lesions or deformities.   Nose:  External nasal examination shows no deformity or inflammation. Nasal mucosa are pink and moist without lesions ,exudates Oral exam: lips and gums are healthy appearing. Neck:  No thyromegaly, masses, tenderness noted.    Heart:  Normal rate and regular rhythm. S1 and S2 normal without gallop, murmur, click, rub .  Abdomen:Bowel sounds are normal. Abdomen is soft and nontender with no organomegaly, hernias,masses. GU: deferred  Extremities:  No cyanosis, clubbing,edema  Neurologic exam : Strength equal  in upper & lower extremities decreased Balance,Rhomberg,finger to nose testing could not be completed due to clinical state Skin: Warm & dry w/o tenting.   See summary under each active problem in the Problem List with associated updated therapeutic plan

## 2015-11-02 NOTE — Patient Instructions (Signed)
Discontinue Risperdal as per Psychiatry consultant recommendations 10/13/15 due to advanced age. Trial of the Buspirone 7.5 mg twice a day.

## 2015-12-05 ENCOUNTER — Encounter: Payer: Self-pay | Admitting: Internal Medicine

## 2015-12-05 ENCOUNTER — Non-Acute Institutional Stay (SKILLED_NURSING_FACILITY): Payer: Medicare Other | Admitting: Internal Medicine

## 2015-12-05 DIAGNOSIS — F0391 Unspecified dementia with behavioral disturbance: Secondary | ICD-10-CM | POA: Diagnosis not present

## 2015-12-05 DIAGNOSIS — E559 Vitamin D deficiency, unspecified: Secondary | ICD-10-CM | POA: Insufficient documentation

## 2015-12-05 DIAGNOSIS — F333 Major depressive disorder, recurrent, severe with psychotic symptoms: Secondary | ICD-10-CM | POA: Diagnosis not present

## 2015-12-05 DIAGNOSIS — E785 Hyperlipidemia, unspecified: Secondary | ICD-10-CM | POA: Diagnosis not present

## 2015-12-05 LAB — BASIC METABOLIC PANEL
BUN: 29 mg/dL — AB (ref 4–21)
Creatinine: 0.6 mg/dL (ref <=1.1)
Potassium: 4.5 mmol/L (ref 3.4–5.3)
SODIUM: 139 mmol/L (ref 137–147)

## 2015-12-05 LAB — VITAMIN D 25 HYDROXY (VIT D DEFICIENCY, FRACTURES): VIT D 25 HYDROXY: 27

## 2015-12-05 LAB — HEPATIC FUNCTION PANEL
ALK PHOS: 60 U/L (ref 25–125)
ALT: 13 U/L (ref 7–35)
AST: 20 U/L (ref 13–35)
BILIRUBIN, TOTAL: 0.4 mg/dL

## 2015-12-05 NOTE — Assessment & Plan Note (Signed)
MMSE scores have varied but there appears to be progressive decline at this time. Today there is no associated behavioral disorder; she's felt to be stable clinically and no change in medication is indicated

## 2015-12-05 NOTE — Progress Notes (Signed)
This is a nursing facility follow up of chronic medical diagnoses Interim medical record and care since [facility] was updated with review of diagnostic studies and change in clinical status since last visit were documented.  HPI: The major issue is her mental illness with severe major depressive with psychotic features and possible dementia with behavioral issues. This is manifested by removing her clothes and stripping her bed and being incontinent of urine. Seroquel was discontinued at the recommendation of the psychiatric nurse. She is on Lamictal 200 mg daily as well Buspar 7.5 mg bid. Despite the diagnoses of dementia her MMSE scores have been 25-30 out of 30 in 2017. The last labs on record  09/04/15 revealed normal lipids, CBC, and TSH . Hepatorenal function was last checked 06/06/15 and was normal. Review of the med list indicates to no medications which warrant repeating these labs at this time. She is not on a statin, it's unclear why cholesterol is being monitored at 95. Bili cardiovascular diagnosis is peripheral vascular disease. She is on high-dose vitamin D ;there is no record of vitamin D level in the chart.  Review of systems: A review of systems was attempted and was negative. She states that she feels good. She tells me she is on no medications ! Constitutional: No fever,significant weight change, fatigue  Eyes: No redness, discharge, pain, vision change ENT/mouth: No nasal congestion,  purulent discharge, earache,change in hearing ,sore throat  Cardiovascular: No chest pain, palpitations,paroxysmal nocturnal dyspnea, claudication, edema  Respiratory: No cough, sputum production,hemoptysis, DOE , significant snoring,apnea  Gastrointestinal: No heartburn,dysphagia,abdominal pain, nausea / vomiting,rectal bleeding, melena,change in bowels Genitourinary: No dysuria,hematuria, pyuria,  incontinence, nocturia Musculoskeletal: No joint stiffness, joint  swelling, weakness,pain Dermatologic: No rash, pruritus, change in appearance of skin Neurologic: No dizziness,headache,syncope, seizures, numbness , tingling Psychiatric: No significant anxiety , depression, insomnia, anorexia Endocrine: No change in hair/skin/ nails, excessive thirst, excessive hunger, excessive urination  Hematologic/lymphatic: No significant bruising, lymphadenopathy,abnormal bleeding Allergy/immunology: No itchy/ watery eyes, significant sneezing, urticaria, angioedema  Physical exam:  Pertinent or positive findings: She appears younger than her stated age. She is communicative but does exhibit some disorientation to current and remote events. She has eroded teeth to the gumline of the maxilla. She has missing lower teeth as well with heavy plaque formation. Grade 1 systolic murmur is present. Valgus deformity of the knees is present with fusiform changes. Pedal pulses are decreased. She has severe atrophy of the lower legs. She has arthritic changes of the hands and feet. The feet reveal flexion contractures and lateral deviation of the toes. She has vertical, linear excoriations over the shins. MMSE was completed. The score was 20 out of 30. As noted prior scores have been  25 or >she was unable to give the month date, day of the week. She was unable to name the county or town. She recalled 2 of 3 items. When asked to spell WORLD backwards she said DROA. She was uncertain [place]. [place] be with [place] but her daughter did not stay in this area. The veracity of this history is not defined.  General appearance:Adequately nourished; no acute distress , increased work of breathing is present.   Lymphatic: No lymphadenopathy about the head, neck, axilla . Eyes: No conjunctival inflammation or lid edema is present. There is no scleral icterus. Ears:  External ear exam shows no significant lesions or deformities.     Nose:  External nasal examination shows no deformity or inflammation. Nasal mucosa are pink and moist without lesions ,exudates Oral exam: lips and gums are healthy appearing.There is no oropharyngeal erythema or exudate . Neck:  No thyromegaly, masses, tenderness noted.    Heart:  Normal rate and regular rhythm. S1 and S2 normal without gallop, click, rub .  Lungs:Chest clear to auscultation without wheezes, rhonchi,rales , rubs. Abdomen:Bowel sounds are normal. Abdomen is soft and nontender with no organomegaly, hernias,masses. GU: deferred  Extremities:  No cyanosis, clubbing,edema  Neurologic exam : Strength equal  in upper & lower extremities but decreased Balance,Rhomberg,finger to nose testing could not be completed due to clinical state Deep tendon reflexes are equal but 0+ at the knees Skin: Warm & dry w/o tenting. No significant lesions or rash.    See summary under each active problem in the Problem List with associated updated therapeutic plan

## 2015-12-05 NOTE — Patient Instructions (Addendum)
Check vitamin D level as she is on high monthly dose  There is no indication for monitoring lipids in this 82 year old female who is not on a statin No changes indicated her psychotropic medications

## 2015-12-05 NOTE — Assessment & Plan Note (Signed)
Lipids have been checked twice within the last year yet she is not on a statin. There is no indication for monitoring lipids in this 82 year old

## 2015-12-05 NOTE — Assessment & Plan Note (Signed)
12/05/15 include she is not depressed at this time and exhibits no psychotic features No change indicated

## 2015-12-05 NOTE — Assessment & Plan Note (Signed)
No vitamin D level on record yet she is on 50,000 international units monthly Check vitamin D level

## 2016-05-16 ENCOUNTER — Encounter: Payer: Self-pay | Admitting: Internal Medicine

## 2016-05-16 ENCOUNTER — Non-Acute Institutional Stay (SKILLED_NURSING_FACILITY): Payer: Medicare Other | Admitting: Internal Medicine

## 2016-05-16 DIAGNOSIS — E785 Hyperlipidemia, unspecified: Secondary | ICD-10-CM | POA: Diagnosis not present

## 2016-05-16 DIAGNOSIS — N182 Chronic kidney disease, stage 2 (mild): Secondary | ICD-10-CM

## 2016-05-16 DIAGNOSIS — F333 Major depressive disorder, recurrent, severe with psychotic symptoms: Secondary | ICD-10-CM

## 2016-05-16 DIAGNOSIS — F0391 Unspecified dementia with behavioral disturbance: Secondary | ICD-10-CM

## 2016-05-16 NOTE — Assessment & Plan Note (Signed)
11/2015 chemistries revealed normal hepatorenal function. No change indicated medicines she is not on any nephrotoxic agents

## 2016-05-16 NOTE — Assessment & Plan Note (Signed)
05/16/16 the patient is amazingly stable on her present psychotropic medications. No changes indicated.

## 2016-05-16 NOTE — Assessment & Plan Note (Signed)
I concur with the assessment by [facility]  That statins are not indicated in this 83 year old patient.

## 2016-05-16 NOTE — Assessment & Plan Note (Signed)
05/16/16 she is pleasantly demented and is charming on interview No indication for adding Aricept or Namenda

## 2016-05-16 NOTE — Progress Notes (Signed)
   [facility] Room: 103  This is a nursing facility follow up of chronic medical diagnoses  Interim medical record and care since [facility] was updated with review of diagnostic studies and change in clinical status since last visit were documented.  HPI: The patient has a history of major depressive disorder. She is on Lamictal.  She also has dyslipidemia. The patient was seen by  [facility] 04/25/16; lipids performed July 2017 were reviewed. The LDL was 124, risk of taking a statin was felt the greater risk than benefit at 95. A recent labs were 12/05/15. Chemistries revealed normal hepatorenal function. To date she has not been anemic. Her last thyroid was therapeutic at 3.73 in July 2017 as well.  Review of systems: Dementia invalidated responses. Date given as 05/14/1990. She corrected this to 1997. She was unable to identify the president. She did give her correct birth date. She kept repeating "how do I look for 89?". She would then slap  her stomach saying " I'm too fat". She specifically denied any other health concerns. When I told her she looked great for 89 she stated it is because she did not "drink, smoke, or bed hop".  Constitutional: No fever, fatigue  Eyes: No redness, discharge, pain, vision change ENT/mouth: No nasal congestion,  purulent discharge, earache,change in hearing ,sore throat  Cardiovascular: No chest pain, palpitations,paroxysmal nocturnal dyspnea, claudication, edema  Respiratory: No cough, sputum production,hemoptysis, DOE , significant snoring,apnea  Gastrointestinal: No heartburn,dysphagia,abdominal pain, nausea / vomiting,rectal bleeding, melena,change in bowels Genitourinary: No dysuria,hematuria, pyuria,  incontinence, nocturia Musculoskeletal: No joint stiffness, joint swelling, weakness,pain Dermatologic: No rash, pruritus, change in appearance of skin Neurologic: No dizziness,headache,syncope, seizures, numbness  , tingling Psychiatric: No significant anxiety , depression, insomnia, anorexia Endocrine: No change in hair/skin/ nails, excessive thirst, excessive hunger, excessive urination  Hematologic/lymphatic: No significant bruising, lymphadenopathy,abnormal bleeding Allergy/immunology: No itchy/ watery eyes, significant sneezing, urticaria, angioedema  Physical exam:  Pertinent or positive findings:.She does look dramatically younger than age. Maxilla is  edentulous. She has multiple missing mandibular teeth. Grade 1 systolic murmuris present. There is fusiform changes of the knees. Pedal pulses are decreased except for the R  posterior tibial pulss. Flexion contractures of toes present. There is atrophy of the calves.  General appearance:Adequately nourished; no acute distress , increased work of breathing is present.   Lymphatic: No lymphadenopathy about the head, neck, axilla . Eyes: No conjunctival inflammation or lid edema is present. There is no scleral icterus. Ears:  External ear exam shows no significant lesions or deformities.   Nose:  External nasal examination shows no deformity or inflammation. Nasal mucosa are pink and moist without lesions ,exudates Oral exam: lips and gums are healthy appearing.There is no oropharyngeal erythema or exudate . Neck:  No thyromegaly, masses, tenderness noted.    Heart:  Normal rate and regular rhythm. S1 and S2 normal without gallop, click, rub .  Lungs:Chest clear to auscultation without wheezes, rhonchi,rales , rubs. Abdomen:Bowel sounds are normal. Abdomen is soft and nontender with no organomegaly, hernias,masses. GU: deferred  Extremities:  No cyanosis, clubbing,edema  Neurologic exam : Strength equal  in upper & lower extremities Skin: Warm & dry w/o tenting. No significant lesions or rash.  See summary under each active problem in the Problem List with associated updated therapeutic plan

## 2016-05-16 NOTE — Patient Instructions (Signed)
See assessment and plan under each diagnosis in the problem list and acutely for this visit 

## 2016-06-25 LAB — CBC AND DIFFERENTIAL
HEMATOCRIT: 39 (ref 36–46)
Hemoglobin: 12.7 (ref 12.0–16.0)
NEUTROS ABS: 3
Platelets: 214 (ref 150–399)
WBC: 5.5

## 2016-08-15 ENCOUNTER — Encounter: Payer: Self-pay | Admitting: Internal Medicine

## 2016-08-15 ENCOUNTER — Non-Acute Institutional Stay (SKILLED_NURSING_FACILITY): Payer: Medicare Other | Admitting: Internal Medicine

## 2016-08-15 DIAGNOSIS — E559 Vitamin D deficiency, unspecified: Secondary | ICD-10-CM

## 2016-08-15 DIAGNOSIS — N182 Chronic kidney disease, stage 2 (mild): Secondary | ICD-10-CM

## 2016-08-15 DIAGNOSIS — F333 Major depressive disorder, recurrent, severe with psychotic symptoms: Secondary | ICD-10-CM | POA: Diagnosis not present

## 2016-08-15 DIAGNOSIS — F0391 Unspecified dementia with behavioral disturbance: Secondary | ICD-10-CM | POA: Diagnosis not present

## 2016-08-15 NOTE — Patient Instructions (Signed)
See assessment and plan under each diagnosis in the problem list and acutely for this visit 

## 2016-08-15 NOTE — Assessment & Plan Note (Signed)
Recheck renal function. ?

## 2016-08-15 NOTE — Assessment & Plan Note (Signed)
08/15/16 please see her colorful comments which suggest some paranoia. Clinically she is stable on the present regimen of Lamictal and BuSpar.
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
## 2016-08-15 NOTE — Assessment & Plan Note (Signed)
08/15/16 As noted some paranoia but no acting out clinically at this time No change in medication indicated Psych will continue to follow

## 2016-08-15 NOTE — Assessment & Plan Note (Signed)
On 50,000 units vitamin D 3 monthly, check vitamin D level

## 2016-08-16 LAB — BASIC METABOLIC PANEL
BUN: 24 — AB (ref 4–21)
Creatinine: 0.6 (ref 0.5–1.1)
GLUCOSE: 79
Potassium: 4.5 (ref 3.4–5.3)
Sodium: 141 (ref 137–147)

## 2016-08-16 LAB — TSH: TSH: 4.69 (ref 0.41–5.90)

## 2016-08-16 LAB — VITAMIN D 25 HYDROXY (VIT D DEFICIENCY, FRACTURES): Vit D, 25-Hydroxy: 31.65

## 2016-10-01 LAB — LIPID PANEL
Cholesterol: 217 — AB (ref 0–200)
HDL: 60 (ref 35–70)
LDL Cholesterol: 141
LDl/HDL Ratio: 3.6
TRIGLYCERIDES: 81 (ref 40–160)

## 2016-10-22 ENCOUNTER — Non-Acute Institutional Stay (SKILLED_NURSING_FACILITY): Payer: Medicare Other

## 2016-10-22 DIAGNOSIS — Z0000 Encounter for general adult medical examination without abnormal findings: Secondary | ICD-10-CM

## 2016-10-22 NOTE — Patient Instructions (Signed)
Holly Rivera , Thank you for taking time to come for your Medicare Wellness Visit. I appreciate your ongoing commitment to your health goals. Please review the following plan we discussed and let me know if I can assist you in the future.   Screening recommendations/referrals: Colonoscopy excluded, pt over age 81 Mammogram excluded, pt over age 6 Bone Density up to date Recommended yearly ophthalmology/optometry visit for glaucoma screening and checkup Recommended yearly dental visit for hygiene and checkup  Vaccinations: Influenza vaccine due 10/26/2016 Pneumococcal vaccine up to date Tdap vaccine due, ordered Shingles vaccine not in records  Advanced directives: DNR in chart, copies of health care power of attorney and living will are needed   Conditions/risks identified: None  Next appointment: Dr. Alwyn Ren makes rounds   Preventive Care 65 Years and Older, Female Preventive care refers to lifestyle choices and visits with your health care provider that can promote health and wellness. What does preventive care include?  A yearly physical exam. This is also called an annual well check.  Dental exams once or twice a year.  Routine eye exams. Ask your health care provider how often you should have your eyes checked.  Personal lifestyle choices, including:  Daily care of your teeth and gums.  Regular physical activity.  Eating a healthy diet.  Avoiding tobacco and drug use.  Limiting alcohol use.  Practicing safe sex.  Taking low-dose aspirin every day.  Taking vitamin and mineral supplements as recommended by your health care provider. What happens during an annual well check? The services and screenings done by your health care provider during your annual well check will depend on your age, overall health, lifestyle risk factors, and family history of disease. Counseling  Your health care provider may ask you questions about your:  Alcohol use.  Tobacco  use.  Drug use.  Emotional well-being.  Home and relationship well-being.  Sexual activity.  Eating habits.  History of falls.  Memory and ability to understand (cognition).  Work and work Astronomer.  Reproductive health. Screening  You may have the following tests or measurements:  Height, weight, and BMI.  Blood pressure.  Lipid and cholesterol levels. These may be checked every 5 years, or more frequently if you are over 35 years old.  Skin check.  Lung cancer screening. You may have this screening every year starting at age 51 if you have a 30-pack-year history of smoking and currently smoke or have quit within the past 15 years.  Fecal occult blood test (FOBT) of the stool. You may have this test every year starting at age 4.  Flexible sigmoidoscopy or colonoscopy. You may have a sigmoidoscopy every 5 years or a colonoscopy every 10 years starting at age 17.  Hepatitis C blood test.  Hepatitis B blood test.  Sexually transmitted disease (STD) testing.  Diabetes screening. This is done by checking your blood sugar (glucose) after you have not eaten for a while (fasting). You may have this done every 1-3 years.  Bone density scan. This is done to screen for osteoporosis. You may have this done starting at age 53.  Mammogram. This may be done every 1-2 years. Talk to your health care provider about how often you should have regular mammograms. Talk with your health care provider about your test results, treatment options, and if necessary, the need for more tests. Vaccines  Your health care provider may recommend certain vaccines, such as:  Influenza vaccine. This is recommended every year.  Tetanus, diphtheria,  and acellular pertussis (Tdap, Td) vaccine. You may need a Td booster every 10 years.  Zoster vaccine. You may need this after age 50.  Pneumococcal 13-valent conjugate (PCV13) vaccine. One dose is recommended after age 14.  Pneumococcal  polysaccharide (PPSV23) vaccine. One dose is recommended after age 1. Talk to your health care provider about which screenings and vaccines you need and how often you need them. This information is not intended to replace advice given to you by your health care provider. Make sure you discuss any questions you have with your health care provider. Document Released: 03/10/2015 Document Revised: 11/01/2015 Document Reviewed: 12/13/2014 Elsevier Interactive Patient Education  2017 [place] Prevention in the Home Falls can cause injuries. They can happen to people of all ages. There are many things you can do to make your home safe and to help prevent falls. What can I do on the outside of my home?  Regularly fix the edges of walkways and driveways and fix any cracks.  Remove anything that might make you trip as you walk through a door, such as a raised step or threshold.  Trim any bushes or trees on the path to your home.  Use bright outdoor lighting.  Clear any walking paths of anything that might make someone trip, such as rocks or tools.  Regularly check to see if handrails are loose or broken. Make sure that both sides of any steps have handrails.  Any raised decks and porches should have guardrails on the edges.  Have any leaves, snow, or ice cleared regularly.  Use sand or salt on walking paths during winter.  Clean up any spills in your garage right away. This includes oil or grease spills. What can I do in the bathroom?  Use night lights.  Install grab bars by the toilet and in the tub and shower. Do not use towel bars as grab bars.  Use non-skid mats or decals in the tub or shower.  If you need to sit down in the shower, use a plastic, non-slip stool.  Keep the floor dry. Clean up any water that spills on the floor as soon as it happens.  Remove soap buildup in the tub or shower regularly.  Attach bath mats securely with double-sided non-slip rug  tape.  Do not have throw rugs and other things on the floor that can make you trip. What can I do in the bedroom?  Use night lights.  Make sure that you have a light by your bed that is easy to reach.  Do not use any sheets or blankets that are too big for your bed. They should not hang down onto the floor.  Have a firm chair that has side arms. You can use this for support while you get dressed.  Do not have throw rugs and other things on the floor that can make you trip. What can I do in the kitchen?  Clean up any spills right away.  Avoid walking on wet floors.  Keep items that you use a lot in easy-to-reach places.  If you need to reach something above you, use a strong step stool that has a grab bar.  Keep electrical cords out of the way.  Do not use floor polish or wax that makes floors slippery. If you must use wax, use non-skid floor wax.  Do not have throw rugs and other things on the floor that can make you trip. What can I do with my  stairs?  Do not leave any items on the stairs.  Make sure that there are handrails on both sides of the stairs and use them. Fix handrails that are broken or loose. Make sure that handrails are as long as the stairways.  Check any carpeting to make sure that it is firmly attached to the stairs. Fix any carpet that is loose or worn.  Avoid having throw rugs at the top or bottom of the stairs. If you do have throw rugs, attach them to the floor with carpet tape.  Make sure that you have a light switch at the top of the stairs and the bottom of the stairs. If you do not have them, ask someone to add them for you. What else can I do to help prevent falls?  Wear shoes that:  Do not have high heels.  Have rubber bottoms.  Are comfortable and fit you well.  Are closed at the toe. Do not wear sandals.  If you use a stepladder:  Make sure that it is fully opened. Do not climb a closed stepladder.  Make sure that both sides of the  stepladder are locked into place.  Ask someone to hold it for you, if possible.  Clearly mark and make sure that you can see:  Any grab bars or handrails.  First and last steps.  Where the edge of each step is.  Use tools that help you move around (mobility aids) if they are needed. These include:  Canes.  Walkers.  Scooters.  Crutches.  Turn on the lights when you go into a dark area. Replace any light bulbs as soon as they burn out.  Set up your furniture so you have a clear path. Avoid moving your furniture around.  If any of your floors are uneven, fix them.  If there are any pets around you, be aware of where they are.  Review your medicines with your doctor. Some medicines can make you feel dizzy. This can increase your chance of falling. Ask your doctor what other things that you can do to help prevent falls. This information is not intended to replace advice given to you by your health care provider. Make sure you discuss any questions you have with your health care provider. Document Released: 12/08/2008 Document Revised: 07/20/2015 Document Reviewed: 03/18/2014 Elsevier Interactive Patient Education  2017 [employer].

## 2016-10-22 NOTE — Progress Notes (Signed)
Subjective:   Holly Rivera is a 83 y.o. female who presents for an Initial Medicare Annual Wellness Visit at [facility]       Objective:    Today's Vitals   10/22/16 1403  BP: 130/70  Pulse: 95  Temp: 97.9 F (36.6 C)  TempSrc: Oral  SpO2: 90%  Weight: 153 lb (69.4 kg)  Height: 4\' 10"  (1.473 m)   Body mass index is 31.98 kg/m.   Current Medications (verified) Outpatient Encounter Prescriptions as of 10/22/2016  Medication Sig  . acetaminophen (TYLENOL) 325 MG tablet Take 650 mg by mouth 2 (two) times daily.  . bisacodyl (DULCOLAX) 10 MG suppository If constipation not relieved by MOM, give 10mg  rectally x 1 dose in 24 hours as needed for constipation  . busPIRone (BUSPAR) 7.5 MG tablet Take 1 tablet (7.5 mg total) by mouth 2 (two) times daily.  . chlorhexidine (PERIDEX) 0.12 % solution Brush on 1tsp of solution to teeth and gums with a toothbursh after PM mouth care. Spit out excess.  . Cholecalciferol (VITAMIN D3) 50000 units CAPS Take 1 capsule by mouth every 30 (thirty) days.  . LamoTRIgine (LAMICTAL ODT) 50 MG TBDP Take 1 tablet by mouth daily.  Marland Kitchen lamoTRIgine (LAMICTAL) 100 MG tablet Take 100 mg by mouth at bedtime.  . magnesium hydroxide (MILK OF MAGNESIA) 400 MG/5ML suspension In no BM in 3 days, give 30cc x 1 dose in 24 hours as needed for constipation  . Omega-3 Fatty Acids (FISH OIL) 1000 MG CPDR Take 1,000 mg by mouth 2 (two) times daily.   No facility-administered encounter medications on file as of 10/22/2016.     Allergies (verified) Penicillins   History: Past Medical History:  Diagnosis Date  . Arthritis   . Depression   . Falls   . Hyperlipidemia   . Major depressive disorder   . Pneumonia   . Thyroid disease    hypothryriodism   Past Surgical History:  Procedure Laterality Date  . APPENDECTOMY     History reviewed. No pertinent family history. Social History   Occupational History  . Not on file.   Social History  Main Topics  . Smoking status: Never Smoker  . Smokeless tobacco: Never Used  . Alcohol use No  . Drug use: Yes  . Sexual activity: No    Tobacco Counseling Counseling given: Not Answered   Activities of Daily Living In your present state of health, do you have any difficulty performing the following activities: 10/22/2016  Hearing? Y  Vision? N  Difficulty concentrating or making decisions? Y  Walking or climbing stairs? Y  Dressing or bathing? Y  Doing errands, shopping? Y  Preparing Food and eating ? Y  Using the Toilet? Y  In the past six months, have you accidently leaked urine? Y  Do you have problems with loss of bowel control? Y  Managing your Medications? Y  Managing your Finances? Y  Housekeeping or managing your Housekeeping? Y  Some recent data might be hidden    Immunizations and Health Maintenance Immunization History  Administered Date(s) Administered  . Influenza-Unspecified 11/25/2012, 12/07/2014, 11/23/2015  . Pneumococcal-Unspecified 11/23/2015   Health Maintenance Due  Topic Date Due  . INFLUENZA VACCINE  09/25/2016    Patient Care Team: Pecola Lawless, MD as PCP - General (Internal Medicine)  Indicate any recent Medical Services you may have received from other than [facility] providers in the past year (date may be approximate).  Assessment:   This is a routine wellness examination for [place].   Hearing/Vision screen No exam data present  Dietary issues and exercise activities discussed: Current Exercise Habits: The patient does not participate in regular exercise at present, Exercise limited by: orthopedic condition(s);neurologic condition(s)  Goals    None     Depression Screen PHQ 2/9 Scores 10/22/2016  PHQ - 2 Score 0    Fall Risk Fall Risk  10/22/2016  Falls in the past year? No    Cognitive Function:     6CIT Screen 10/22/2016  What Year? 4 points  What month? 3 points  What time? 3 points  Count back from 20 0  points  Months in reverse 0 points  Repeat phrase 10 points  Total Score 20    Screening Tests Health Maintenance  Topic Date Due  . INFLUENZA VACCINE  09/25/2016  . TETANUS/TDAP  06/25/2018 (Originally 07/10/1937)  . DEXA SCAN  Completed  . PNA vac Low Risk Adult  Completed      Plan:    I have personally reviewed and addressed the Medicare Annual Wellness questionnaire and have noted the following in the patient's chart:  A. Medical and social history B. Use of alcohol, tobacco or illicit drugs  C. Current medications and supplements D. Functional ability and status E.  Nutritional status F.  Physical activity G. Advance directives H. List of other physicians I.  Hospitalizations, surgeries, and ER visits in previous 12 months J.  Vitals K. Screenings to include hearing, vision, cognitive, depression L. Referrals and appointments - none  In addition, I have reviewed and discussed with patient certain preventive protocols, quality metrics, and best practice recommendations. A written personalized care plan for preventive services as well as general preventive health recommendations were provided to patient.  See attached scanned questionnaire for additional information.   Signed,   Annetta Maw, RN Nurse Health Advisor   Quick Notes   Health Maintenance: TDAP due     Abnormal Screen: 6 CIT-20     Patient Concerns: none     Nurse Concerns: none I have personally reviewed the health advisor's clinical note, was available for consultation, and agree with the assessment and plan as written. Pecola Lawless M.D., FACP, [facility]

## 2016-10-23 ENCOUNTER — Non-Acute Institutional Stay (SKILLED_NURSING_FACILITY): Payer: Medicare Other | Admitting: Internal Medicine

## 2016-10-23 ENCOUNTER — Encounter: Payer: Self-pay | Admitting: Internal Medicine

## 2016-10-23 DIAGNOSIS — J209 Acute bronchitis, unspecified: Secondary | ICD-10-CM

## 2016-10-23 LAB — BASIC METABOLIC PANEL
BUN: 23 — AB (ref 4–21)
BUN: 23 — AB (ref 4–21)
Creatinine: 0.6 (ref 0.5–1.1)
Creatinine: 0.6 (ref 0.5–1.1)
GLUCOSE: 104
GLUCOSE: 104
POTASSIUM: 4 (ref 3.4–5.3)
POTASSIUM: 4 (ref 3.4–5.3)
SODIUM: 143 (ref 137–147)
SODIUM: 143 (ref 137–147)

## 2016-10-23 LAB — CBC AND DIFFERENTIAL
HEMATOCRIT: 44 (ref 36–46)
HEMOGLOBIN: 13.7 (ref 12.0–16.0)
Neutrophils Absolute: 4
Platelets: 201 (ref 150–399)
WBC: 6.9

## 2016-10-23 NOTE — Patient Instructions (Signed)
See assessment and plan under acute diagnosis for this visit 

## 2016-10-23 NOTE — Progress Notes (Signed)
    NURSING HOME LOCATION:  [facility] ROOM NUMBER:  103-A  CODE STATUS:  DNR  PCP:  Pecola LawlessHopper, William F, MD  [address] [place] [id number]  This is a nursing facility follow up for specific acute issue of  of chronic medical diagnoses  Nursing Facility readmission within 30 days  Interim medical record and care [facility] was updated with review of diagnostic studies and change in clinical status since last visit were documented.  HPI:  Review of systems: Dementia invalidated responses. Date given as   Constitutional: No fever,significant weight change, fatigue  Eyes: No redness, discharge, pain, vision change ENT/mouth: No nasal congestion,  purulent discharge, earache,change in hearing ,sore throat  Cardiovascular: No chest pain, palpitations,paroxysmal nocturnal dyspnea, claudication, edema  Respiratory: No cough, sputum production,hemoptysis, DOE , significant snoring,apnea  Gastrointestinal: No heartburn,dysphagia,abdominal pain, nausea / vomiting,rectal bleeding, melena,change in bowels Genitourinary: No dysuria,hematuria, pyuria,  incontinence, nocturia Musculoskeletal: No joint stiffness, joint swelling, weakness,pain Dermatologic: No rash, pruritus, change in appearance of skin Neurologic: No dizziness,headache,syncope, seizures, numbness , tingling Psychiatric: No significant anxiety , depression, insomnia, anorexia Endocrine: No change in hair/skin/ nails, excessive thirst, excessive hunger, excessive urination  Hematologic/lymphatic: No significant bruising, lymphadenopathy,abnormal bleeding Allergy/immunology: No itchy/ watery eyes, significant sneezing, urticaria, angioedema  Physical exam:  Pertinent or positive findings: General appearance:Adequately nourished; no acute distress , increased work of breathing is present.   Lymphatic: No lymphadenopathy about the head, neck, axilla . Eyes: No conjunctival inflammation or lid  edema is present. There is no scleral icterus. Ears:  External ear exam shows no significant lesions or deformities.   Nose:  External nasal examination shows no deformity or inflammation. Nasal mucosa are pink and moist without lesions ,exudates Oral exam: lips and gums are healthy appearing.There is no oropharyngeal erythema or exudate . Neck:  No thyromegaly, masses, tenderness noted.    Heart:  Normal rate and regular rhythm. S1 and S2 normal without gallop, murmur, click, rub .  Lungs:Chest clear to auscultation without wheezes, rhonchi,rales , rubs. Abdomen:Bowel sounds are normal. Abdomen is soft and nontender with no organomegaly, hernias,masses. GU: deferred  Extremities:  No cyanosis, clubbing,edema  Neurologic exam : Cn 2-7 intact Strength equal  in upper & lower extremities Balance,Rhomberg,finger to nose testing could not be completed due to clinical state Deep tendon reflexes are equal Skin: Warm & dry w/o tenting. No significant lesions or rash.  See summary under each active problem in the Problem List with associated updated therapeutic plan

## 2016-10-23 NOTE — Progress Notes (Signed)
    This is a nursing facility follow up for specific acute issue of Possible stridor.  Interim medical record and care since last [facility] visit was updated with review of diagnostic studies and change in clinical status since last visit were documented.  HPI: Holly Rivera, [employer] NP had noted stridorous type respirations over the upper airway and initiated pulmonary toilet & ordered chest x-ray. I reviewed the chest x-ray with her. This showed poor inspiration to the sixth interspace with accentuation of the reticular-nodular markings. There was no evidence of heart failure or pneumonia. The patient can give no meaningful history due to her severe advanced dementia. She perserverates on getting out of bed; she has no significant pulmonary complaints. The patient states she never was a smoker. Significant history includes anaphylactic reaction to penicillins.  Review of systems: Dementia invalidated responses.  Cardiovascular: No chest pain, palpitations,paroxysmal nocturnal dyspnea  Respiratory: No cough, sputum production,hemoptysis, Allergy/immunology: No itchy/ watery eyes, significant sneezing, urticaria, angioedema  Physical exam:  Pertinent or positive findings: Very thin hair with associated alopecia which is non-pattern. She exhibits intermittent weak cough. Her head tends to sit on the thorax and she has some stridorous upper airway sounds. Significant carious changes of remaining teeth present. Oropharynx is dry with thick ,clear mucoid secretions posteriorly. Eyebrows are essentially absent. Heart sounds are obscured by coarse rhonchi which are symmetric and diffuse. Pedal pulses are decreased. With stimulation of the lower extremities the toes are upgoing. She has isolated bunion formation and flexion contractures of the toes.  General appearance:Adequately nourished; no acute distress , increased work of breathing is present.   Lymphatic: No lymphadenopathy  about the head, neck, axilla . Eyes: No conjunctival inflammation or lid edema is present. There is no scleral icterus. Ears:  External ear exam shows no significant lesions or deformities.   Nose:  External nasal examination shows no deformity or inflammation. Nasal mucosa are pink and moist without lesions ,exudates Oral exam: lips and gums are healthy appearing.There is no oropharyngeal erythema or exudate . Neck:  No thyromegaly, masses, tenderness noted.    Abdomen:Bowel sounds are normal. Abdomen is soft and nontender with no organomegaly, hernias,masses. GU: deferred  Extremities:  No cyanosis, clubbing,edema  Skin: Warm & dry w/o tenting. No significant lesions or rash.  #1 acute bronchitis with bronchospastic component Plan: Pulmonary toilet, oral steroids, antibiotics

## 2016-12-20 ENCOUNTER — Emergency Department (HOSPITAL_COMMUNITY): Payer: Medicare Other

## 2016-12-20 ENCOUNTER — Emergency Department (HOSPITAL_COMMUNITY)
Admission: EM | Admit: 2016-12-20 | Discharge: 2016-12-20 | Disposition: A | Discharge status: Home / Self Care | Payer: Medicare Other | Attending: Emergency Medicine | Admitting: Emergency Medicine

## 2016-12-20 DIAGNOSIS — E039 Hypothyroidism, unspecified: Secondary | ICD-10-CM | POA: Diagnosis not present

## 2016-12-20 DIAGNOSIS — S0083XA Contusion of other part of head, initial encounter: Secondary | ICD-10-CM | POA: Diagnosis not present

## 2016-12-20 DIAGNOSIS — F0391 Unspecified dementia with behavioral disturbance: Secondary | ICD-10-CM | POA: Insufficient documentation

## 2016-12-20 DIAGNOSIS — Y939 Activity, unspecified: Secondary | ICD-10-CM | POA: Diagnosis not present

## 2016-12-20 DIAGNOSIS — N182 Chronic kidney disease, stage 2 (mild): Secondary | ICD-10-CM | POA: Diagnosis not present

## 2016-12-20 DIAGNOSIS — S0993XA Unspecified injury of face, initial encounter: Secondary | ICD-10-CM | POA: Diagnosis present

## 2016-12-20 DIAGNOSIS — Y999 Unspecified external cause status: Secondary | ICD-10-CM | POA: Insufficient documentation

## 2016-12-20 DIAGNOSIS — W19XXXA Unspecified fall, initial encounter: Secondary | ICD-10-CM | POA: Insufficient documentation

## 2016-12-20 DIAGNOSIS — Y92129 Unspecified place in nursing home as the place of occurrence of the external cause: Secondary | ICD-10-CM | POA: Diagnosis not present

## 2016-12-20 DIAGNOSIS — M25531 Pain in right wrist: Secondary | ICD-10-CM | POA: Insufficient documentation

## 2016-12-20 DIAGNOSIS — Z79899 Other long term (current) drug therapy: Secondary | ICD-10-CM | POA: Insufficient documentation

## 2016-12-20 NOTE — ED Notes (Signed)
[facility] facility notified of patient return.

## 2016-12-20 NOTE — ED Provider Notes (Signed)
[facility] EMERGENCY DEPARTMENT Provider Note  CSN: [id number] Arrival date & time: 12/20/16 4098  Chief Complaint(s) Fall  HPI Holly Rivera is a 83 y.o. female who presents to the emergency department with unwitnessed fall.  Per EMS patient was found next to her bed this morning.  She was alert and conscious when staff went in.  They try to help the patient up to bed and noted that she had a hematoma on her right forehead.  Per EMS, staff reported the patient is at her baseline mental status.  They report that she is on Plavix however we noted that EMS received the wrong patient's medication list.  Will call the skilled nursing facility to obtain an updated medication list and further history.  Patient currently complains of forehead pain and right wrist pain.  Skilled nursing facility reported that the patient was seen 5 minutes prior to her fall sitting on the side of the bed.  They report that she frequently falls while trying to get out of bed.  We were able to confirm her medication and patient is not on any anticoagulation.  Remainder of history, ROS, and physical exam limited due to patient's condition (dementia). Additional information was obtained from EMS and sNF.   Level V Caveat.    HPI  Past Medical History Past Medical History:  Diagnosis Date  . Arthritis   . Depression   . Falls   . Hyperlipidemia   . Major depressive disorder   . Pneumonia   . Thyroid disease    hypothryriodism   Patient Active Problem List   Diagnosis Date Noted  . Vitamin D deficiency 12/05/2015  . High risk medication use 09/06/2015  . Severe episode of recurrent major depressive disorder, with psychotic features (HCC) 05/01/2015  . Mood disorder (HCC) 08/02/2014  . Hyperlipidemia LDL goal <130 06/21/2013  . PVD (peripheral vascular disease) (HCC) 04/14/2013  . CKD (chronic kidney disease) stage 2, GFR 60-89 ml/min 04/14/2013  . Dementia with behavioral  disturbance 02/15/2013  . Depression 11/15/2012  . Osteoarthrosis, unspecified whether generalized or localized, involving lower leg 06/29/2012  . Memory loss 06/29/2012  . Unable to ambulate 04/09/2012  . Frequent falls 04/09/2012   Home Medication(s) Prior to Admission medications   Medication Sig Start Date End Date Taking? Authorizing Provider  acetaminophen (TYLENOL) 325 MG tablet Take 650 mg by mouth 2 (two) times daily.    [provider]  bisacodyl (DULCOLAX) 10 MG suppository If constipation not relieved by MOM, give 10mg  rectally x 1 dose in 24 hours as needed for constipation    [provider]  busPIRone (BUSPAR) 7.5 MG tablet Take 1 tablet (7.5 mg total) by mouth 2 (two) times daily. 11/02/15   Pecola Lawless, MD  Cholecalciferol (VITAMIN D3) 50000 units CAPS Take 1 capsule by mouth every 30 (thirty) days.    [provider]  guaiFENesin (MUCINEX) 600 MG 12 hr tablet Take 600 mg by mouth every 12 (twelve) hours.    [provider]  ipratropium-albuterol (DUONEB) 0.5-2.5 (3) MG/3ML SOLN Take 3 mLs by nebulization every 4 (four) hours. 10/23/16 10/25/16  [provider]  lamoTRIgine (LAMICTAL) 100 MG tablet Take 100 mg by mouth 2 (two) times daily.     [provider]  magnesium hydroxide (MILK OF MAGNESIA) 400 MG/5ML suspension In no BM in 3 days, give 30cc x 1 dose in 24 hours as needed for constipation    [provider]  Omega-3  Fatty Acids (FISH OIL) 1000 MG CPDR Take 1,000 mg by mouth 2 (two) times daily.    [provider]                                                                                                                                    Past Surgical History Past Surgical History:  Procedure Laterality Date  . APPENDECTOMY     Family History No family history on file.  Social History Social History  Substance Use Topics  . Smoking status: Never Smoker  . Smokeless tobacco: Never  Used  . Alcohol use No   Allergies Penicillins  Review of Systems Review of Systems  Unable to perform ROS: Dementia    Physical Exam Vital Signs  I have reviewed the triage vital signs BP (!) 151/77 (BP Location: Right Arm)   Pulse 75   Temp (!) 96.4 F (35.8 C) (Oral)   Resp 15   Ht 4\' 10"  (1.473 m)   Wt 69.4 kg (153 lb)   SpO2 100%   BMI 31.98 kg/m   Physical Exam  Constitutional: She is oriented to person, place, and time. She appears well-developed and well-nourished. No distress.  HENT:  Head: Normocephalic. Head is with contusion.    Right Ear: External ear normal.  Left Ear: External ear normal.  Nose: Nose normal.  Mouth/Throat: Abnormal dentition.  Eyes: Pupils are equal, round, and reactive to light. Conjunctivae and EOM are normal. Right eye exhibits no discharge. Left eye exhibits no discharge. No scleral icterus.  Neck: Normal range of motion. Neck supple.  Cardiovascular: Normal rate, regular rhythm and normal heart sounds.  Exam reveals no gallop and no friction rub.   No murmur heard. Pulses:      Radial pulses are 2+ on the right side, and 2+ on the left side.       Dorsalis pedis pulses are 2+ on the right side, and 2+ on the left side.  Pulmonary/Chest: Effort normal and breath sounds normal. No stridor. No respiratory distress. She has no wheezes.  Abdominal: Soft. She exhibits no distension. There is no tenderness.  Musculoskeletal: She exhibits no edema.       Right wrist: She exhibits tenderness (Mild). She exhibits normal range of motion, no crepitus and no deformity.       Cervical back: She exhibits no bony tenderness.       Thoracic back: She exhibits no bony tenderness.       Lumbar back: She exhibits no bony tenderness.  Clavicles stable. Chest stable to AP/Lat compression. Pelvis stable to Lat compression. No obvious extremity deformity. No chest or abdominal wall contusion.  Neurological: She is alert and oriented to person, place,  and time.  Moving all extremities  Skin: Skin is warm and dry. No rash noted. She is not diaphoretic. No erythema.  Psychiatric: She has a normal mood and affect.  ED Results and Treatments Labs (all labs ordered are listed, but only abnormal results are displayed) Labs Reviewed - No data to display                                                                                                                       EKG  EKG Interpretation  Date/Time:  Friday December 20 2016 08:39:21 EDT Ventricular Rate:  79 PR Interval:    QRS Duration: 139 QT Interval:  431 QTC Calculation: 495 R Axis:   -42 Text Interpretation:  Sinus rhythm RBBB and LAFB Probable left ventricular hypertrophy Lateral infarct, age indeterminate No significant change since last tracing Confirmed by Drema Pryardama, Romie Tay [phone]([id number]) on 12/20/2016 9:36:21 AM      Radiology Dg Wrist Complete Right  Result Date: 12/20/2016 CLINICAL DATA:  Pain following fall EXAM: RIGHT WRIST - COMPLETE 3+ VIEW COMPARISON:  None. FINDINGS: Frontal, oblique, lateral, and ulnar deviation scaphoid images were obtained. There is no evident acute fracture or dislocation. There is marked narrowing of the radiocarpal and ulnocarpal joints as well as narrowing in the first carpal -metacarpal joint. There is also moderately severe narrowing of the scaphotrapezial joint. Multiple intra-articular calcifications are noted. No erosive change. Bones are osteoporotic. There is extensive osteoarthritic change in the first, second, third, and fourth MCP joints. Bones are osteoporotic. Pronator quadratus fat pad is not elevated. IMPRESSION: Extensive multilevel osteoarthritic change. Intra-articular calcification is likely of arthropathic etiology, although prior trauma could account for some or all of this calcification. Bones are diffusely osteoporotic. No acute fracture or dislocation evident. Electronically Signed   By: Bretta BangWilliam  Woodruff III M.D.   On: 12/20/2016  09:25   Ct Head Wo Contrast  Result Date: 12/20/2016 CLINICAL DATA:  Pain following fall EXAM: CT HEAD WITHOUT CONTRAST TECHNIQUE: Contiguous axial images were obtained from the base of the skull through the vertex without intravenous contrast. COMPARISON:  December 13, 2014 FINDINGS: Brain: Mild to moderate diffuse atrophy is stable. There is no intracranial mass, hemorrhage, extra-axial fluid collection, or midline shift. There is patchy small vessel disease in the centra semiovale bilaterally, stable. Small vessel disease is also noted in each lentiform nucleus and internal capsule regions. There is no new gray-white compartment lesion. No acute infarct evident. Vascular: There are no hyperdense vessels. There is calcification in each carotid siphon region. There is also calcification in each distal vertebral artery. Skull: Bony calvarium appears intact. There is a right frontal scalp hematoma. Sinuses/Orbits: There is opacification in multiple ethmoid air cells. Other paranasal sinuses are clear. Orbits appear symmetric bilaterally except for apparent cataract removal previously on the left. Other: Mastoid air cells are clear. IMPRESSION: Stable atrophy with supratentorial small vessel disease. No intracranial mass, hemorrhage, or extra-axial fluid collection. No acute infarct evident. There is a right frontal scalp hematoma without underlying fracture. There are foci of arterial vascular calcification. There is ethmoid paranasal sinus disease. Electronically Signed   By: Bretta BangWilliam  Woodruff III M.D.   On: 12/20/2016  09:06   Pertinent labs & imaging results that were available during my care of the patient were reviewed by me and considered in my medical decision making (see chart for details).  Medications Ordered in ED Medications - No data to display                                                                                                                                   Procedures Procedures  (including critical care time)  Medical Decision Making / ED Course I have reviewed the nursing notes for this encounter and the patient's prior records (if available in EHR or on provided paperwork).    CT head without evidence of acute ICH or fracture.  Did note the right frontal hematoma.  Plain film of the right wrist without evidence of acute fracture dislocation. The patient is safe for discharge with strict return precautions.   Final Clinical Impression(s) / ED Diagnoses Final diagnoses:  Fall  Fall, initial encounter  Contusion of face, initial encounter  Right wrist pain   Disposition: Discharge  Condition: Good    New Prescriptions   No medications on file    Follow Up: Pecola Lawless, MD [address] [place] [address] [phone]   As needed      This chart was dictated using voice recognition software.  Despite best efforts to proofread,  errors can occur which can change the documentation meaning.   Nira Conn, MD 12/20/16 [phone]

## 2016-12-20 NOTE — ED Triage Notes (Signed)
Pt arrives to ED from assisted living facility with complaints of a fall. Pt was found by staff at 0730 this morning on the floor next to her bed. Pt is oriented to self and situation baseline. Pt states she is not on blood thinners, but med records will be sent from assisted living facility to verify. Pt presents with 2" bump on right forehead from fall, complaint is of headache and wanting to go home. Pt placed in position of comfort with bed locked and lowered, call bell in reach.

## 2016-12-20 NOTE — ED Notes (Signed)
Pt transporting to CT  

## 2016-12-22 ENCOUNTER — Emergency Department (HOSPITAL_COMMUNITY)
Admission: EM | Admit: 2016-12-22 | Discharge: 2016-12-22 | Disposition: A | Discharge status: Home / Self Care | Payer: Medicare Other | Attending: Emergency Medicine | Admitting: Emergency Medicine

## 2016-12-22 ENCOUNTER — Encounter (HOSPITAL_COMMUNITY): Payer: Self-pay

## 2016-12-22 DIAGNOSIS — W06XXXA Fall from bed, initial encounter: Secondary | ICD-10-CM | POA: Insufficient documentation

## 2016-12-22 DIAGNOSIS — Z79899 Other long term (current) drug therapy: Secondary | ICD-10-CM | POA: Insufficient documentation

## 2016-12-22 DIAGNOSIS — Y929 Unspecified place or not applicable: Secondary | ICD-10-CM | POA: Insufficient documentation

## 2016-12-22 DIAGNOSIS — Y999 Unspecified external cause status: Secondary | ICD-10-CM | POA: Insufficient documentation

## 2016-12-22 DIAGNOSIS — Y939 Activity, unspecified: Secondary | ICD-10-CM | POA: Diagnosis not present

## 2016-12-22 DIAGNOSIS — R296 Repeated falls: Secondary | ICD-10-CM | POA: Insufficient documentation

## 2016-12-22 DIAGNOSIS — S0990XA Unspecified injury of head, initial encounter: Secondary | ICD-10-CM | POA: Diagnosis present

## 2016-12-22 DIAGNOSIS — S0083XA Contusion of other part of head, initial encounter: Secondary | ICD-10-CM | POA: Insufficient documentation

## 2016-12-22 DIAGNOSIS — F0390 Unspecified dementia without behavioral disturbance: Secondary | ICD-10-CM | POA: Insufficient documentation

## 2016-12-22 DIAGNOSIS — W19XXXA Unspecified fall, initial encounter: Secondary | ICD-10-CM

## 2016-12-22 LAB — URINALYSIS, ROUTINE W REFLEX MICROSCOPIC
Bilirubin Urine: NEGATIVE
GLUCOSE, UA: NEGATIVE mg/dL
HGB URINE DIPSTICK: NEGATIVE
Ketones, ur: NEGATIVE mg/dL
LEUKOCYTES UA: NEGATIVE
Nitrite: NEGATIVE
PH: 6 (ref 5.0–8.0)
PROTEIN: NEGATIVE mg/dL
Specific Gravity, Urine: 1.016 (ref 1.005–1.030)

## 2016-12-22 LAB — CBC WITH DIFFERENTIAL/PLATELET
Basophils Absolute: 0 10*3/uL (ref 0.0–0.1)
Basophils Relative: 1 %
EOS ABS: 0.1 10*3/uL (ref 0.0–0.7)
EOS PCT: 1 %
HCT: 43.5 % (ref 36.0–46.0)
Hemoglobin: 14.1 g/dL (ref 12.0–15.0)
LYMPHS ABS: 1.6 10*3/uL (ref 0.7–4.0)
LYMPHS PCT: 25 %
MCH: 30.9 pg (ref 26.0–34.0)
MCHC: 32.4 g/dL (ref 30.0–36.0)
MCV: 95.4 fL (ref 78.0–100.0)
MONO ABS: 0.8 10*3/uL (ref 0.1–1.0)
MONOS PCT: 12 %
Neutro Abs: 4 10*3/uL (ref 1.7–7.7)
Neutrophils Relative %: 61 %
PLATELETS: 238 10*3/uL (ref 150–400)
RBC: 4.56 MIL/uL (ref 3.87–5.11)
RDW: 14.5 % (ref 11.5–15.5)
WBC: 6.5 10*3/uL (ref 4.0–10.5)

## 2016-12-22 LAB — I-STAT CHEM 8, ED
BUN: 23 mg/dL — ABNORMAL HIGH (ref 6–20)
CALCIUM ION: 1.12 mmol/L — AB (ref 1.15–1.40)
CHLORIDE: 101 mmol/L (ref 101–111)
CREATININE: 0.7 mg/dL (ref 0.44–1.00)
GLUCOSE: 105 mg/dL — AB (ref 65–99)
HCT: 44 % (ref 36.0–46.0)
Hemoglobin: 15 g/dL (ref 12.0–15.0)
POTASSIUM: 3.4 mmol/L — AB (ref 3.5–5.1)
Sodium: 142 mmol/L (ref 135–145)
TCO2: 29 mmol/L (ref 22–32)

## 2016-12-22 NOTE — ED Notes (Signed)
[facility] contacted to transport patient to [facility] 

## 2016-12-22 NOTE — ED Triage Notes (Signed)
Pt from [facility] by [facility]. Pt has had 5 falls in the last 3 days which is a lot more than normal for this pt per facility. Pt has dementia and non ambulatory.

## 2016-12-22 NOTE — ED Provider Notes (Signed)
[facility] Provider Note   CSN: [id number] Arrival date & time: 12/22/16  1035     History   Chief Complaint Chief Complaint  Patient presents with  . Fall   Level 5 caveat due to dementia HPI Holly Rivera is a 83 y.o. female.  HPI Patient presents after a fall.  Reportedly fell out of bed again.  Has had 5 falls in the last 3 days.  Is baseline nonambulatory but still attempts to walk.  Patient complains of a slight headache.  Patient is awake and pleasant.  Reportedly has had a good appetite. Past Medical History:  Diagnosis Date  . Arthritis   . Depression   . Falls   . Hyperlipidemia   . Major depressive disorder   . Pneumonia   . Thyroid disease    hypothryriodism    Patient Active Problem List   Diagnosis Date Noted  . Vitamin D deficiency 12/05/2015  . High risk medication use 09/06/2015  . Severe episode of recurrent major depressive disorder, with psychotic features (HCC) 05/01/2015  . Mood disorder (HCC) 08/02/2014  . Hyperlipidemia LDL goal <130 06/21/2013  . PVD (peripheral vascular disease) (HCC) 04/14/2013  . CKD (chronic kidney disease) stage 2, GFR 60-89 ml/min 04/14/2013  . Dementia with behavioral disturbance 02/15/2013  . Depression 11/15/2012  . Osteoarthrosis, unspecified whether generalized or localized, involving lower leg 06/29/2012  . Memory loss 06/29/2012  . Unable to ambulate 04/09/2012  . Frequent falls 04/09/2012    Past Surgical History:  Procedure Laterality Date  . APPENDECTOMY      OB History    No data available       Home Medications    Prior to Admission medications   Medication Sig Start Date End Date Taking? Authorizing Provider  acetaminophen (TYLENOL) 325 MG tablet Take 650 mg by mouth 2 (two) times daily.    [provider]  bisacodyl (DULCOLAX) 10 MG suppository If constipation not relieved by MOM, give 10mg  rectally x 1 dose in 24 hours as needed for  constipation    [provider]  busPIRone (BUSPAR) 7.5 MG tablet Take 1 tablet (7.5 mg total) by mouth 2 (two) times daily. 11/02/15   Pecola LawlessHopper, William F, MD  Cholecalciferol (VITAMIN D3) 50000 units CAPS Take 1 capsule by mouth every 30 (thirty) days.    [provider]  guaiFENesin (MUCINEX) 600 MG 12 hr tablet Take 600 mg by mouth every 12 (twelve) hours.    [provider]  ipratropium-albuterol (DUONEB) 0.5-2.5 (3) MG/3ML SOLN Take 3 mLs by nebulization every 4 (four) hours. 10/23/16 10/25/16  [provider]  lamoTRIgine (LAMICTAL) 100 MG tablet Take 100 mg by mouth 2 (two) times daily.     [provider]  magnesium hydroxide (MILK OF MAGNESIA) 400 MG/5ML suspension In no BM in 3 days, give 30cc x 1 dose in 24 hours as needed for constipation    [provider]  Omega-3 Fatty Acids (FISH OIL) 1000 MG CPDR Take 1,000 mg by mouth 2 (two) times daily.    [provider]    Family History No family history on file.  Social History Social History  Substance Use Topics  . Smoking status: Never Smoker  . Smokeless tobacco: Never Used  . Alcohol use No     Allergies   Penicillins   Review of Systems Review of Systems  Unable to perform ROS: Dementia     Physical Exam Updated Vital  Signs BP (!) 142/85   Pulse 87   Temp 98.1 F (36.7 C) (Oral)   Resp 17   SpO2 98%   Physical Exam  Constitutional: She appears well-nourished.  HENT:  Hematoma on forehead.  Left side may be new the right side appears more chronic  Eyes: EOM are normal.  Neck: Normal range of motion. Neck supple.  Supple with painless range of motion.  Cardiovascular: Normal rate.   Pulmonary/Chest: Effort normal. She has no wheezes.  Abdominal: Soft. There is no tenderness. There is no guarding.  Musculoskeletal: She exhibits no edema or tenderness.  Neurological: She is alert.  Patient is awake and pleasant, with mild dementia.  Skin: Skin is  warm.  Psychiatric: She has a normal mood and affect.  Nursing note and vitals reviewed.    ED Treatments / Results  Labs (all labs ordered are listed, but only abnormal results are displayed) Labs Reviewed  I-STAT CHEM 8, ED - Abnormal; Notable for the following:       Result Value   Potassium 6.9 (*)    BUN 33 (*)    Glucose, Bld 111 (*)    Calcium, Ion 1.01 (*)    All other components within normal limits  I-STAT CHEM 8, ED - Abnormal; Notable for the following:    Potassium 3.4 (*)    BUN 23 (*)    Glucose, Bld 105 (*)    Calcium, Ion 1.12 (*)    All other components within normal limits  URINALYSIS, ROUTINE W REFLEX MICROSCOPIC  CBC WITH DIFFERENTIAL/PLATELET  CBC WITH DIFFERENTIAL/PLATELET    EKG  EKG Interpretation None       Radiology No results found.  Procedures Procedures (including critical care time)  Medications Ordered in ED Medications - No data to display   Initial Impression / Assessment and Plan / ED Course  I have reviewed the triage vital signs and the nursing notes.  Pertinent labs & imaging results that were available during my care of the patient were reviewed by me and considered in my medical decision making (see chart for details).     Patient with recurrent falls.  History of dementia.  Slightly more falls than normal.  Head CT 2 days ago was reassuring.  Have been monitoring patient while she is here in mental lab work and urine done to evaluate for more frequent falls and is reassuring.  She is not on anticoagulation.  He appears safe for discharge home.  I doubt a severe intracranial injury at this time.  Final Clinical Impressions(s) / ED Diagnoses   Final diagnoses:  Fall, initial encounter  Contusion of face, initial encounter    New Prescriptions New Prescriptions   No medications on file     Benjiman Core, MD 12/22/16 1345

## 2016-12-22 NOTE — ED Notes (Signed)
Report given to RN at [facility].  

## 2016-12-22 NOTE — ED Notes (Signed)
Fall precautions started for pt

## 2016-12-23 LAB — I-STAT CHEM 8, ED
BUN: 33 mg/dL — AB (ref 6–20)
CREATININE: 0.7 mg/dL (ref 0.44–1.00)
Calcium, Ion: 1.01 mmol/L — ABNORMAL LOW (ref 1.15–1.40)
Chloride: 103 mmol/L (ref 101–111)
GLUCOSE: 111 mg/dL — AB (ref 65–99)
HCT: 44 % (ref 36.0–46.0)
HEMOGLOBIN: 15 g/dL (ref 12.0–15.0)
POTASSIUM: 6.9 mmol/L — AB (ref 3.5–5.1)
Sodium: 137 mmol/L (ref 135–145)
TCO2: 28 mmol/L (ref 22–32)

## 2016-12-26 ENCOUNTER — Encounter: Payer: Self-pay | Admitting: Internal Medicine

## 2016-12-26 NOTE — Progress Notes (Signed)
    NURSING HOME LOCATION:  [facility] ROOM NUMBER:  [id number]  CODE STATUS:  DNR  PCP:  Pecola LawlessHopper, William F, MD  [address] [place] [id number]  This is a nursing facility follow up for specific acute issue of    of chronic medical diagnoses  Nursing Facility readmission within 30 days  Interim medical record and care since last [facility] was updated with review of diagnostic studies and change in clinical status since last visit were documented.  HPI:  Review of systems: Dementia invalidated responses. Date given as   Constitutional: No fever,significant weight change, fatigue  Eyes: No redness, discharge, pain, vision change ENT/mouth: No nasal congestion,  purulent discharge, earache,change in hearing ,sore throat  Cardiovascular: No chest pain, palpitations,paroxysmal nocturnal dyspnea, claudication, edema  Respiratory: No cough, sputum production,hemoptysis, DOE , significant snoring,apnea  Gastrointestinal: No heartburn,dysphagia,abdominal pain, nausea / vomiting,rectal bleeding, melena,change in bowels Genitourinary: No dysuria,hematuria, pyuria,  incontinence, nocturia Musculoskeletal: No joint stiffness, joint swelling, weakness,pain Dermatologic: No rash, pruritus, change in appearance of skin Neurologic: No dizziness,headache,syncope, seizures, numbness , tingling Psychiatric: No significant anxiety , depression, insomnia, anorexia Endocrine: No change in hair/skin/ nails, excessive thirst, excessive hunger, excessive urination  Hematologic/lymphatic: No significant bruising, lymphadenopathy,abnormal bleeding Allergy/immunology: No itchy/ watery eyes, significant sneezing, urticaria, angioedema  Physical exam:  Pertinent or positive findings: General appearance:Adequately nourished; no acute distress , increased work of breathing is present.   Lymphatic: No lymphadenopathy about the head, neck, axilla . Eyes: No conjunctival inflammation or lid  edema is present. There is no scleral icterus. Ears:  External ear exam shows no significant lesions or deformities.   Nose:  External nasal examination shows no deformity or inflammation. Nasal mucosa are pink and moist without lesions ,exudates Oral exam: lips and gums are healthy appearing.There is no oropharyngeal erythema or exudate . Neck:  No thyromegaly, masses, tenderness noted.    Heart:  Normal rate and regular rhythm. S1 and S2 normal without gallop, murmur, click, rub .  Lungs:Chest clear to auscultation without wheezes, rhonchi,rales , rubs. Abdomen:Bowel sounds are normal. Abdomen is soft and nontender with no organomegaly, hernias,masses. GU: deferred  Extremities:  No cyanosis, clubbing,edema  Neurologic exam : Cn 2-7 intact Strength equal  in upper & lower extremities Balance,Rhomberg,finger to nose testing could not be completed due to clinical state Deep tendon reflexes are equal Skin: Warm & dry w/o tenting. No significant lesions or rash.  See summary under each active problem in the Problem List with associated updated therapeutic plan      This encounter was created in error - please disregard.

## 2016-12-30 ENCOUNTER — Encounter (HOSPITAL_COMMUNITY): Payer: Self-pay | Admitting: Emergency Medicine

## 2016-12-30 ENCOUNTER — Other Ambulatory Visit: Payer: Self-pay

## 2016-12-30 ENCOUNTER — Emergency Department (HOSPITAL_COMMUNITY)
Admission: EM | Admit: 2016-12-30 | Discharge: 2017-01-01 | Disposition: A | Discharge status: Home / Self Care | Payer: Medicare Other | Attending: Emergency Medicine | Admitting: Emergency Medicine

## 2016-12-30 DIAGNOSIS — N182 Chronic kidney disease, stage 2 (mild): Secondary | ICD-10-CM | POA: Insufficient documentation

## 2016-12-30 DIAGNOSIS — B372 Candidiasis of skin and nail: Secondary | ICD-10-CM | POA: Diagnosis not present

## 2016-12-30 DIAGNOSIS — F23 Brief psychotic disorder: Secondary | ICD-10-CM | POA: Insufficient documentation

## 2016-12-30 DIAGNOSIS — F05 Delirium due to known physiological condition: Secondary | ICD-10-CM | POA: Diagnosis not present

## 2016-12-30 DIAGNOSIS — R45851 Suicidal ideations: Secondary | ICD-10-CM | POA: Insufficient documentation

## 2016-12-30 DIAGNOSIS — E039 Hypothyroidism, unspecified: Secondary | ICD-10-CM | POA: Insufficient documentation

## 2016-12-30 DIAGNOSIS — F0391 Unspecified dementia with behavioral disturbance: Secondary | ICD-10-CM | POA: Diagnosis not present

## 2016-12-30 DIAGNOSIS — Z79899 Other long term (current) drug therapy: Secondary | ICD-10-CM | POA: Diagnosis not present

## 2016-12-30 DIAGNOSIS — R296 Repeated falls: Secondary | ICD-10-CM | POA: Insufficient documentation

## 2016-12-30 DIAGNOSIS — F919 Conduct disorder, unspecified: Secondary | ICD-10-CM | POA: Diagnosis present

## 2016-12-30 LAB — URINALYSIS, ROUTINE W REFLEX MICROSCOPIC
Bilirubin Urine: NEGATIVE
Glucose, UA: NEGATIVE mg/dL
Hgb urine dipstick: NEGATIVE
KETONES UR: 5 mg/dL — AB
NITRITE: POSITIVE — AB
PH: 5 (ref 5.0–8.0)
Protein, ur: NEGATIVE mg/dL
SPECIFIC GRAVITY, URINE: 1.03 (ref 1.005–1.030)

## 2016-12-30 LAB — ACETAMINOPHEN LEVEL: Acetaminophen (Tylenol), Serum: 11 ug/mL (ref 10–30)

## 2016-12-30 LAB — ETHANOL

## 2016-12-30 LAB — CBC
HCT: 38.1 % (ref 36.0–46.0)
HEMOGLOBIN: 12.5 g/dL (ref 12.0–15.0)
MCH: 31.3 pg (ref 26.0–34.0)
MCHC: 32.8 g/dL (ref 30.0–36.0)
MCV: 95.3 fL (ref 78.0–100.0)
PLATELETS: 258 10*3/uL (ref 150–400)
RBC: 4 MIL/uL (ref 3.87–5.11)
RDW: 14.1 % (ref 11.5–15.5)
WBC: 6.8 10*3/uL (ref 4.0–10.5)

## 2016-12-30 LAB — RAPID URINE DRUG SCREEN, HOSP PERFORMED
Amphetamines: NOT DETECTED
Barbiturates: NOT DETECTED
Benzodiazepines: NOT DETECTED
Cocaine: NOT DETECTED
OPIATES: NOT DETECTED
TETRAHYDROCANNABINOL: NOT DETECTED

## 2016-12-30 LAB — COMPREHENSIVE METABOLIC PANEL
ALK PHOS: 55 U/L (ref 38–126)
ALT: 17 U/L (ref 14–54)
AST: 20 U/L (ref 15–41)
Albumin: 3.4 g/dL — ABNORMAL LOW (ref 3.5–5.0)
Anion gap: 9 (ref 5–15)
BILIRUBIN TOTAL: 0.5 mg/dL (ref 0.3–1.2)
BUN: 26 mg/dL — ABNORMAL HIGH (ref 6–20)
CALCIUM: 9.1 mg/dL (ref 8.9–10.3)
CO2: 29 mmol/L (ref 22–32)
CREATININE: 0.71 mg/dL (ref 0.44–1.00)
Chloride: 105 mmol/L (ref 101–111)
Glucose, Bld: 91 mg/dL (ref 65–99)
Potassium: 3.7 mmol/L (ref 3.5–5.1)
Sodium: 143 mmol/L (ref 135–145)
TOTAL PROTEIN: 5.9 g/dL — AB (ref 6.5–8.1)

## 2016-12-30 LAB — SALICYLATE LEVEL

## 2016-12-30 MED ORDER — BUSPIRONE HCL 5 MG PO TABS
7.5000 mg | ORAL_TABLET | Freq: Two times a day (BID) | ORAL | Status: DC
  Administered 2016-12-30 – 2016-12-31 (×2): 7.5 mg via ORAL
  Administered 2016-12-31: 10:00:00 via ORAL
  Filled 2016-12-30 (×4): qty 1.5

## 2016-12-30 MED ORDER — LAMOTRIGINE 100 MG PO TABS
100.0000 mg | ORAL_TABLET | Freq: Two times a day (BID) | ORAL | Status: DC
  Administered 2016-12-30 – 2016-12-31 (×3): 100 mg via ORAL
  Filled 2016-12-30 (×5): qty 1

## 2016-12-30 MED ORDER — NYSTATIN 100000 UNIT/GM EX POWD
Freq: Once | CUTANEOUS | Status: AC
  Administered 2017-01-01: 07:00:00 via TOPICAL
  Filled 2016-12-30 (×2): qty 15

## 2016-12-30 NOTE — BH Assessment (Signed)
[facility] Assessment Progress Note  TTS consulted with Donell SievertSpencer Simon, PA who recommends continued observation due to conflicting information from the pt's nursing home facility and what the pt is reporting in regards to her AMS and SI. EDP McDonald, Mia A, PA-C contacted and in agreement with disposition. Pt's nurse notified of recommendation.   Princess BruinsAquicha Hanley Woerner, MSW, LCSW Therapeutic Triage Specialist  [phone]

## 2016-12-30 NOTE — ED Triage Notes (Signed)
Per EMS , pt from [facility]. Pt been saying she is suicidal, staff reported that pt pulled the fire alarm to get staff attention. Pt reports that no body believes her. Pt in room calling this staff and reporting seeing the ceiling coming down on her.  Per staff no symptoms of medical issue. No Hx dementia. Pt answered assessment question appropriatly . Alert and oriented  X4 per EMS.

## 2016-12-30 NOTE — ED Notes (Signed)
Holly Sporelark, NP pt's provider at the facility called checking on pt's status, these are her numbers, [phone]. After 5 pm use  [phone].   Holly SporeClark reports that pt had multiple falls, last fall had head injury, old bruises noted to the  Forehead.

## 2016-12-30 NOTE — ED Notes (Signed)
Spoke with pts daughter regarding further plans for pt care. Informed her that pt will be observed overnight and reevaluated in the morning. Informed daughter that she is more than welcome to call back tomorrow afternoon for further update on pt plan of care.

## 2016-12-30 NOTE — ED Provider Notes (Signed)
[facility]-EMERGENCY DEPT Provider Note   CSN: [id number] Arrival date & time: 12/30/16  1304     History   Chief Complaint Chief Complaint  Patient presents with  . Suicidal    HPI Holly Rivera is a 83 y.o. female a history of major depressive disorder with psychotic features and dementia who presents to the emergency department from [place] with suicidal ideation. Spoke with Herbert Seta, clinical care coordinator, who states that the patient has pulled a fire alarms 6 times over the last week.  She also states that the patient has had 5 falls since 10/26.  She was evaluated in the emergency department on October 26 and 28.  She states that the patient has said "the bed is throwing her on to the floor."  Mother reports that the patient has also stated "the lights and ceiling are falling", and earlier today stated "I want to kill myself."  Herbert Seta reports when EMS arrived that the patient denied SI and stated "I would never say that. I'm not stupid."  She reports this is a significant change from the patient's baseline, which she describes as pleasantly demented.  She reports that psychiatric care is not currently available at the assisted living facility, and if she is returned to [facility] there is not a Building control surveyor member to adjust her medications or provide care since the facility lost their psychiatry contract over the last few months.  In the ED, the patient states "I love living, and I would never say that I wanted to hurt myself. I wish I could go back and live again like when I was 37."  She denies homicidal ideation and auditory and visual hallucinations. She complains of a pruritis rash to her bilateral groin that is causing her significant discomfort at this time. No dysuria, abdominal pain, or back pain.   Current medications include Lamictal and BuSpar.   The history is provided by the patient. No language interpreter was used.    Past Medical  History:  Diagnosis Date  . Arthritis   . Depression   . Falls   . Hyperlipidemia   . Major depressive disorder   . Pneumonia   . Thyroid disease    hypothryriodism    Patient Active Problem List   Diagnosis Date Noted  . Delirium due to another medical condition 12/31/2016  . Vitamin D deficiency 12/05/2015  . High risk medication use 09/06/2015  . Severe episode of recurrent major depressive disorder, with psychotic features (HCC) 05/01/2015  . Mood disorder (HCC) 08/02/2014  . Hyperlipidemia LDL goal <130 06/21/2013  . PVD (peripheral vascular disease) (HCC) 04/14/2013  . CKD (chronic kidney disease) stage 2, GFR 60-89 ml/min 04/14/2013  . Dementia with behavioral disturbance 02/15/2013  . Depression 11/15/2012  . Osteoarthrosis, unspecified whether generalized or localized, involving lower leg 06/29/2012  . Memory loss 06/29/2012  . Unable to ambulate 04/09/2012  . Frequent falls 04/09/2012    Past Surgical History:  Procedure Laterality Date  . APPENDECTOMY      OB History    No data available       Home Medications    Prior to Admission medications   Medication Sig Start Date End Date Taking? Authorizing Provider  bisacodyl (DULCOLAX) 10 MG suppository Place 10 mg daily as needed rectally for moderate constipation. If constipation not relieved by MOM, give 10mg  rectally x 1 dose in 24 hours as needed for constipation    Yes [provider]  busPIRone (BUSPAR)  7.5 MG tablet Take 1 tablet (7.5 mg total) by mouth 2 (two) times daily. 11/02/15  Yes Pecola LawlessHopper, William F, MD  Cholecalciferol (VITAMIN D3) 50000 units CAPS Take 1 capsule every 30 (thirty) days by mouth. On the 12th of the month.   Yes [provider]  lamoTRIgine (LAMICTAL) 100 MG tablet Take 100 mg by mouth 2 (two) times daily.    Yes [provider]  magnesium hydroxide (MILK OF MAGNESIA) 400 MG/5ML suspension Take 30 mLs daily as needed by mouth for mild constipation. In no BM  in 3 days, give 30cc x 1 dose in 24 hours as needed for constipation    Yes [provider]  Omega-3 Fatty Acids (FISH OIL) 1000 MG CPDR Take 1,000 mg by mouth 2 (two) times daily.   Yes [provider]  Sodium Phosphates (RA SALINE ENEMA) 19-7 GM/118ML ENEM Place 118 mLs daily as needed rectally (severe constipation). Constipation not relieved by bisacodyl suppository give one enema daily as needed.   Yes [provider]  cephALEXin (KEFLEX) 500 MG capsule Take 1 capsule (500 mg total) every 12 (twelve) hours by mouth. 01/02/17   Laveda AbbeParks, Laurie Britton, NP    Family History No family history on file.  Social History Social History   Tobacco Use  . Smoking status: Never Smoker  . Smokeless tobacco: Never Used  Substance Use Topics  . Alcohol use: No  . Drug use: Yes     Allergies   Penicillins   Review of Systems Review of Systems  Constitutional: Negative for activity change.  Respiratory: Negative for shortness of breath.   Cardiovascular: Negative for chest pain.  Gastrointestinal: Negative for abdominal pain.  Genitourinary: Negative for dysuria, frequency and urgency.       Vaginal itching  Musculoskeletal: Negative for back pain.  Skin: Negative for rash.   Physical Exam Updated Vital Signs BP (!) 165/95 (BP Location: Left Arm)   Pulse 88   Temp (!) 97.4 F (36.3 C) (Oral)   Resp 16   SpO2 93%   Physical Exam  Constitutional: No distress.  HENT:  Head: Normocephalic.  Eyes: Conjunctivae are normal.  Neck: Neck supple.  Cardiovascular: Normal rate and regular rhythm. Exam reveals no gallop and no friction rub.  No murmur heard. Pulmonary/Chest: Effort normal. No respiratory distress.  Abdominal: Soft. She exhibits no distension and no mass. There is no tenderness. There is no rebound and no guarding.  Genitourinary:  Genitourinary Comments: Beefy red maculopapular rash noted to the bilateral groin.  Neurological: She is alert.    Skin: Skin is warm. No rash noted.  Psychiatric: Her behavior is normal.  Nursing note and vitals reviewed.    ED Treatments / Results  Labs (all labs ordered are listed, but only abnormal results are displayed) Labs Reviewed  URINE CULTURE - Abnormal; Notable for the following components:      Result Value   Culture >=100,000 COLONIES/mL ESCHERICHIA COLI (*)    All other components within normal limits  COMPREHENSIVE METABOLIC PANEL - Abnormal; Notable for the following components:   BUN 26 (*)    Total Protein 5.9 (*)    Albumin 3.4 (*)    All other components within normal limits  URINALYSIS, ROUTINE W REFLEX MICROSCOPIC - Abnormal; Notable for the following components:   APPearance CLOUDY (*)    Ketones, ur 5 (*)    Nitrite POSITIVE (*)    Leukocytes, UA LARGE (*)    Bacteria, UA MANY (*)  Squamous Epithelial / LPF 6-30 (*)    All other components within normal limits  ETHANOL  SALICYLATE LEVEL  ACETAMINOPHEN LEVEL  CBC  RAPID URINE DRUG SCREEN, HOSP PERFORMED    EKG  EKG Interpretation None       Radiology No results found.  Procedures Procedures (including critical care time)  Medications Ordered in ED Medications  busPIRone (BUSPAR) tablet 7.5 mg (0 mg Oral Hold 01/01/17 1245)  lamoTRIgine (LAMICTAL) tablet 100 mg (0 mg Oral Hold 01/01/17 1245)  cephALEXin (KEFLEX) capsule 500 mg (not administered)  nystatin (MYCOSTATIN/NYSTOP) topical powder ( Topical Given 01/01/17 0634)  sterile water (preservative free) injection (  Given 12/31/16 1015)  lidocaine (PF) (XYLOCAINE) 1 % injection (  Given 01/01/17 1005)     Initial Impression / Assessment and Plan / ED Course  I have reviewed the triage vital signs and the nursing notes.  Pertinent labs & imaging results that were available during my care of the patient were reviewed by me and considered in my medical decision making (see chart for details).     84 year old female with a h/o of dementia and MDD  with psychotic features presenting from [facility] for mental health evaluation. The patient was discussed with Dr. Particia Nearing, attending physician. Patient denies SI, HI, and auditory or visual hallucinations at this time. Nursing staff at [facility] state that patient has endorsed SI and hallucinations at the facility. On physical exam, the patient has a beefy red rash consistent with candidal dermatis. Nystatin powder ordered. Urine concerning for infection, but the specimen also contains many squamous cells. She has no urinary symptoms. Will order urine culture, but hold on antibiotics until her culture returns since she has been treated a number of times for UTIs and may be colonized. At this time, the patient is medically cleared. Consulted TTS who assessed the patient and recommended AM psych evaluation.  Final Clinical Impressions(s) / ED Diagnoses   Final diagnoses:  Brief psychotic disorder (HCC)  Delirium due to another medical condition  Candidal dermatitis    ED Discharge Orders        Ordered    cephALEXin (KEFLEX) 500 MG capsule  Every 12 hours     01/01/17 1427    Increase activity slowly     01/01/17 1427    Diet - low sodium heart healthy     01/01/17 1427       Bernice Mcauliffe A, PA-C 01/01/17 1723    Jacalyn Lefevre, MD 01/07/17 [phone]

## 2016-12-30 NOTE — ED Notes (Signed)
Pt is alert and orinted x 3 and is verbally responsive. Pt denies that she has any ideas of hurting or harming herself " io love living, I wish I was younger and had more time to live.

## 2016-12-30 NOTE — BH Assessment (Addendum)
Assessment Note  Holly Rivera is an 83 y.o. female who presents to the ED voluntarily after an incident that took place at her nursing home. TTS spoke with Herbert Seta at [facility] who states the pt continues to have multiple falls and today kept saying the bed was "throwing her into the floor." Herbert Seta reports the pt has also been hallucinating saying the walls are moving, the lights are coming down, and things are coming out of the wall. Herbert Seta reports the pt told her she was going to kill herself today and also pulled fire alarm 6 times today. Herbert Seta states this is unusual for the pt as she does not have a psych history and she has been getting worse over the past several weeks. Herbert Seta states their facility is In between psychiatrist at this time, therefore she brought the pt to the ED for safety.   During the assessment, the pt denies this to TTS. Pt states she is not suicidal and stated "I love my life. I would never try to kill myself. I wish we never had to die. I love life." Pt was pleasant throughout the assessment, smiled and made appropriate eye contact. Pt was asked if she has ever used any substances including drugs or consumed alcohol and pt stated "no I would never drink that. I like orange juice and water and tea and I like coffee."   TTS consulted with Donell Sievert, PA who recommends continued observation due to conflicting information from the pt's nursing home facility and what the pt is reporting in regards to her AMS and SI. EDP McDonald, Mia A, PA-C contacted and in agreement with disposition. Pt's nurse notified of recommendation.   Diagnosis: Brief Psychotic Disorder   Past Medical History:  Past Medical History:  Diagnosis Date  . Arthritis   . Depression   . Falls   . Hyperlipidemia   . Major depressive disorder   . Pneumonia   . Thyroid disease    hypothryriodism    Past Surgical History:  Procedure Laterality Date  . APPENDECTOMY      Family  History: No family history on file.  Social History:  reports that  has never smoked. she has never used smokeless tobacco. She reports that she uses drugs. She reports that she does not drink alcohol.  Additional Social History:  Alcohol / Drug Use Pain Medications: See MAR Prescriptions: See MAR Over the Counter: See MAR History of alcohol / drug use?: No history of alcohol / drug abuse  CIWA: CIWA-Ar BP: 124/64 Pulse Rate: 88 COWS:    Allergies:  Allergies  Allergen Reactions  . Penicillins Anaphylaxis    Home Medications:  (Not in a hospital admission)  OB/GYN Status:  No LMP recorded. Patient is postmenopausal.  General Assessment Data Location of Assessment: [facility] ED TTS Assessment: In system Is this a Tele or Face-to-Face Assessment?: Face-to-Face Is this an Initial Assessment or a Re-assessment for this encounter?: Initial Assessment Marital status: Widowed Is patient pregnant?: No Pregnancy Status: No Living Arrangements: Other (Comment)([facility] ) Can pt return to current living arrangement?: Yes Admission Status: Voluntary Is patient capable of signing voluntary admission?: Yes Referral Source: Self/Family/Friend Insurance type: [facility]     Crisis Care Plan Living Arrangements: Other (Comment)([facility] ) Name of Psychiatrist: none Name of Therapist: none  Education Status Is patient currently in school?: No Highest grade of school patient has completed: 12th Contact person: self  Risk to self with the past  6 months Suicidal Ideation: Yes-Currently Present(pt denies, collateral states pt expressed SI today ) Has patient been a risk to self within the past 6 months prior to admission? : No Suicidal Intent: No Has patient had any suicidal intent within the past 6 months prior to admission? : No Is patient at risk for suicide?: Yes(per collateral info) Suicidal Plan?: No Has patient had any suicidal plan within the past 6  months prior to admission? : No Access to Means: No What has been your use of drugs/alcohol within the last 12 months?: denies Previous Attempts/Gestures: No Triggers for Past Attempts: None known Intentional Self Injurious Behavior: None Family Suicide History: No Recent stressful life event(s): Other (Comment)(recent AMS ) Persecutory voices/beliefs?: No Depression: No Substance abuse history and/or treatment for substance abuse?: No Suicide prevention information given to non-admitted patients: Not applicable  Risk to Others within the past 6 months Homicidal Ideation: No Does patient have any lifetime risk of violence toward others beyond the six months prior to admission? : No Thoughts of Harm to Others: No Current Homicidal Intent: No Current Homicidal Plan: No Access to Homicidal Means: No History of harm to others?: No Assessment of Violence: None Noted Does patient have access to weapons?: No Criminal Charges Pending?: No Does patient have a court date: No Is patient on probation?: No  Psychosis Hallucinations: Visual(per collateral info) Delusions: Unspecified  Mental Status Report Appearance/Hygiene: Unremarkable Eye Contact: Good Motor Activity: Unsteady Speech: Logical/coherent, Soft Level of Consciousness: Alert Mood: Pleasant Affect: Appropriate to circumstance Anxiety Level: None Thought Processes: Relevant, Coherent Judgement: Unimpaired Orientation: Person Obsessive Compulsive Thoughts/Behaviors: None  Cognitive Functioning Concentration: Fair Memory: Recent Impaired, Remote Impaired IQ: Average Insight: Good Impulse Control: Good Appetite: Good Sleep: No Change Total Hours of Sleep: 8 Vegetative Symptoms: None  ADLScreening [facility] Assessment Services) Patient's cognitive ability adequate to safely complete daily activities?: Yes Patient able to express need for assistance with ADLs?: Yes Independently performs ADLs?: No  Prior Inpatient  Therapy Prior Inpatient Therapy: No  Prior Outpatient Therapy Prior Outpatient Therapy: No Does patient have an ACCT team?: No Does patient have Intensive In-House Services?  : No Does patient have [facility] services? : No Does patient have P4CC services?: No  ADL Screening (condition at time of admission) Patient's cognitive ability adequate to safely complete daily activities?: Yes Is the patient deaf or have difficulty hearing?: Yes Does the patient have difficulty seeing, even when wearing glasses/contacts?: No Does the patient have difficulty concentrating, remembering, or making decisions?: Yes Patient able to express need for assistance with ADLs?: Yes Does the patient have difficulty dressing or bathing?: Yes Independently performs ADLs?: No Communication: Independent Dressing (OT): Needs assistance Is this a change from baseline?: Pre-admission baseline Grooming: Needs assistance Is this a change from baseline?: Pre-admission baseline Feeding: Independent Bathing: Needs assistance Is this a change from baseline?: Pre-admission baseline Toileting: Needs assistance Is this a change from baseline?: Pre-admission baseline In/Out Bed: Needs assistance Is this a change from baseline?: Pre-admission baseline Walks in Home: Needs assistance Is this a change from baseline?: Pre-admission baseline Weakness of Legs: Both Weakness of Arms/Hands: None  Home Assistive Devices/Equipment Home Assistive Devices/Equipment: Wheelchair    Abuse/Neglect Assessment (Assessment to be complete while patient is alone) Abuse/Neglect Assessment Can Be Completed: Yes Physical Abuse: Denies Verbal Abuse: Denies Sexual Abuse: Denies Exploitation of patient/patient's resources: Denies Self-Neglect: Denies     Merchant navy officerAdvance Directives (For Healthcare) Does Patient Have a Medical Advance Directive?: Yes Type of Advance Directive: Out  of facility DNR (pink MOST or yellow form)(per chart )     Additional Information 1:1 In Past 12 Months?: No CIRT Risk: No Elopement Risk: No Does patient have medical clearance?: Yes     Disposition:  Disposition Initial Assessment Completed for this Encounter: Yes Disposition of Patient: Re-evaluation by Psychiatry recommended(per Donell Sievert, PA)  On Site Evaluation by:   Reviewed with Physician:    Karolee Ohs 12/30/2016 8:52 PM

## 2016-12-31 DIAGNOSIS — F05 Delirium due to known physiological condition: Secondary | ICD-10-CM

## 2016-12-31 MED ORDER — CEFTRIAXONE SODIUM 1 G IJ SOLR
1.0000 g | INTRAMUSCULAR | Status: DC
  Administered 2016-12-31 – 2017-01-01 (×2): 1 g via INTRAMUSCULAR
  Filled 2016-12-31 (×2): qty 10

## 2016-12-31 MED ORDER — STERILE WATER FOR INJECTION IJ SOLN
INTRAMUSCULAR | Status: AC
  Administered 2016-12-31: 10:00:00
  Filled 2016-12-31: qty 10

## 2016-12-31 NOTE — ED Notes (Signed)
Prior medications were not given.  Patient refused the po meds.  Rocephin given IM.

## 2016-12-31 NOTE — Consult Note (Addendum)
[facility] Consult   Reason for Consult:  SI Referring Physician:  EDP Patient Identification: Holly Rivera MRN:  [id number] Principal Diagnosis: Delirium due to another medical condition Diagnosis:   Patient Active Problem List   Diagnosis Date Noted  . Vitamin D deficiency [E55.9] 12/05/2015  . High risk medication use [Z79.899] 09/06/2015  . Severe episode of recurrent major depressive disorder, with psychotic features ([place]) [F33.3] 05/01/2015  . Mood disorder ([place]) [F39] 08/02/2014  . Hyperlipidemia LDL goal <130 [E78.5] 06/21/2013  . PVD (peripheral vascular disease) ([place]) [I73.9] 04/14/2013  . CKD (chronic kidney disease) stage 2, GFR 60-89 ml/min [N18.2] 04/14/2013  . Dementia with behavioral disturbance [F03.91] 02/15/2013  . Depression [F32.9] 11/15/2012  . Osteoarthrosis, unspecified whether generalized or localized, involving lower leg [M17.10] 06/29/2012  . Memory loss [R41.3] 06/29/2012  . Unable to ambulate [R26.2] 04/09/2012  . Frequent falls [R29.6] 04/09/2012    Total Time spent with patient: 45 minutes  Subjective:   Holly Rivera is a 83 y.o. female patient admitted with SI from her nursing home.  HPI:   Holly Rivera denies SI today and does not remember making suicidal statements prior to admission. She denies HI. She denies AVH. She states that she loves her nursing facility. She denies problems with sleep and appetite. She reports burning with urination more recently but denies other somatic complaints.   Past Psychiatric History: Dementia and depression   Risk to Self: Denies SI.  Risk to Others: Homicidal Ideation: No Thoughts of Harm to Others: No Current Homicidal Intent: No Current Homicidal Plan: No Access to Homicidal Means: No History of harm to others?: No Assessment of Violence: None Noted Does patient have access to weapons?: No Criminal Charges Pending?: No Does patient have a court date: No Prior  Inpatient Therapy: Prior Inpatient Therapy: No Prior Outpatient Therapy: Prior Outpatient Therapy: No Does patient have an ACCT team?: No Does patient have Intensive In-House Services?  : No Does patient have [facility] services? : No Does patient have [facility] services?: No  Past Medical History:  Past Medical History:  Diagnosis Date  . Arthritis   . Depression   . Falls   . Hyperlipidemia   . Major depressive disorder   . Pneumonia   . Thyroid disease    hypothryriodism    Past Surgical History:  Procedure Laterality Date  . APPENDECTOMY     Family History: No family history on file. Family Psychiatric  History: Unknown  Social History:  Social History   Substance and Sexual Activity  Alcohol Use No     Social History   Substance and Sexual Activity  Drug Use Yes    Social History   Socioeconomic History  . Marital status: Widowed    Spouse name: None  . Number of children: None  . Years of education: None  . Highest education level: None  Social Needs  . Financial resource strain: None  . Food insecurity - worry: None  . Food insecurity - inability: None  . Transportation needs - medical: None  . Transportation needs - non-medical: None  Occupational History  . None  Tobacco Use  . Smoking status: Never Smoker  . Smokeless tobacco: Never Used  Substance and Sexual Activity  . Alcohol use: No  . Drug use: Yes  . Sexual activity: No  Other Topics Concern  . None  Social History Narrative  . None   Additional Social History:    Allergies:   Allergies  Allergen Reactions  . Penicillins Anaphylaxis    Labs:  Results for orders placed or performed during the hospital encounter of 12/30/16 (from the past 48 hour(s))  Comprehensive metabolic panel     Status: Abnormal   Collection Time: 12/30/16  5:11 PM  Result Value Ref Range   Sodium 143 135 - 145 mmol/L   Potassium 3.7 3.5 - 5.1 mmol/L   Chloride 105 101 - 111 mmol/L   CO2 29 22 - 32 mmol/L    Glucose, Bld 91 65 - 99 mg/dL   BUN 26 (H) 6 - 20 mg/dL   Creatinine, Ser 0.71 0.44 - 1.00 mg/dL   Calcium 9.1 8.9 - 10.3 mg/dL   Total Protein 5.9 (L) 6.5 - 8.1 g/dL   Albumin 3.4 (L) 3.5 - 5.0 g/dL   AST 20 15 - 41 U/L   ALT 17 14 - 54 U/L   Alkaline Phosphatase 55 38 - 126 U/L   Total Bilirubin 0.5 0.3 - 1.2 mg/dL   GFR calc non Af Amer >60 >60 mL/min   GFR calc Af Amer >60 >60 mL/min    Comment: (NOTE) The eGFR has been calculated using the CKD EPI equation. This calculation has not been validated in all clinical situations. eGFR's persistently <60 mL/min signify possible Chronic Kidney Disease.    Anion gap 9 5 - 15  Ethanol     Status: None   Collection Time: 12/30/16  5:11 PM  Result Value Ref Range   Alcohol, Ethyl (B) <10 <10 mg/dL    Comment:        LOWEST DETECTABLE LIMIT FOR SERUM ALCOHOL IS 10 mg/dL FOR MEDICAL PURPOSES ONLY   Salicylate level     Status: None   Collection Time: 12/30/16  5:11 PM  Result Value Ref Range   Salicylate Lvl <8.4 2.8 - 30.0 mg/dL  Acetaminophen level     Status: None   Collection Time: 12/30/16  5:11 PM  Result Value Ref Range   Acetaminophen (Tylenol), Serum 11 10 - 30 ug/mL    Comment:        THERAPEUTIC CONCENTRATIONS VARY SIGNIFICANTLY. A RANGE OF 10-30 ug/mL MAY BE AN EFFECTIVE CONCENTRATION FOR MANY PATIENTS. HOWEVER, SOME ARE BEST TREATED AT CONCENTRATIONS OUTSIDE THIS RANGE. ACETAMINOPHEN CONCENTRATIONS >150 ug/mL AT 4 HOURS AFTER INGESTION AND >50 ug/mL AT 12 HOURS AFTER INGESTION ARE OFTEN ASSOCIATED WITH TOXIC REACTIONS.   cbc     Status: None   Collection Time: 12/30/16  5:11 PM  Result Value Ref Range   WBC 6.8 4.0 - 10.5 K/uL   RBC 4.00 3.87 - 5.11 MIL/uL   Hemoglobin 12.5 12.0 - 15.0 g/dL   HCT 38.1 36.0 - 46.0 %   MCV 95.3 78.0 - 100.0 fL   MCH 31.3 26.0 - 34.0 pg   MCHC 32.8 30.0 - 36.0 g/dL   RDW 14.1 11.5 - 15.5 %   Platelets 258 150 - 400 K/uL  Rapid urine drug screen (hospital performed)      Status: None   Collection Time: 12/30/16  8:27 PM  Result Value Ref Range   Opiates NONE DETECTED NONE DETECTED   Cocaine NONE DETECTED NONE DETECTED   Benzodiazepines NONE DETECTED NONE DETECTED   Amphetamines NONE DETECTED NONE DETECTED   Tetrahydrocannabinol NONE DETECTED NONE DETECTED   Barbiturates NONE DETECTED NONE DETECTED    Comment:        DRUG SCREEN FOR MEDICAL PURPOSES ONLY.  IF CONFIRMATION IS NEEDED FOR ANY PURPOSE,  NOTIFY LAB WITHIN 5 DAYS.        LOWEST DETECTABLE LIMITS FOR URINE DRUG SCREEN Drug Class       Cutoff (ng/mL) Amphetamine      1000 Barbiturate      200 Benzodiazepine   810 Tricyclics       175 Opiates          300 Cocaine          300 THC              50   Urinalysis, Routine w reflex microscopic     Status: Abnormal   Collection Time: 12/30/16  8:27 PM  Result Value Ref Range   Color, Urine YELLOW YELLOW   APPearance CLOUDY (A) CLEAR   Specific Gravity, Urine 1.030 1.005 - 1.030   pH 5.0 5.0 - 8.0   Glucose, UA NEGATIVE NEGATIVE mg/dL   Hgb urine dipstick NEGATIVE NEGATIVE   Bilirubin Urine NEGATIVE NEGATIVE   Ketones, ur 5 (A) NEGATIVE mg/dL   Protein, ur NEGATIVE NEGATIVE mg/dL   Nitrite POSITIVE (A) NEGATIVE   Leukocytes, UA LARGE (A) NEGATIVE   RBC / HPF 6-30 0 - 5 RBC/hpf   WBC, UA TOO NUMEROUS TO COUNT 0 - 5 WBC/hpf   Bacteria, UA MANY (A) NONE SEEN   Squamous Epithelial / LPF 6-30 (A) NONE SEEN   Ca Oxalate Crys, UA PRESENT     Current Facility-Administered Medications  Medication Dose Route Frequency Provider Last Rate Last Dose  . busPIRone (BUSPAR) tablet 7.5 mg  7.5 mg Oral BID McDonald, Mia A, PA-C      . cefTRIAXone (ROCEPHIN) injection 1 g  1 g Intramuscular Q24H Buford Dresser J, DO   1 g at 12/31/16 1028  . lamoTRIgine (LAMICTAL) tablet 100 mg  100 mg Oral BID McDonald, Mia A, PA-C   100 mg at 12/31/16 1029  . nystatin (MYCOSTATIN/NYSTOP) topical powder   Topical Once McDonald, Mia A, PA-C      . sterile water  (preservative free) injection            Current Outpatient Medications  Medication Sig Dispense Refill  . acetaminophen (TYLENOL) 325 MG tablet Take 650 mg by mouth 2 (two) times daily.    . bisacodyl (DULCOLAX) 10 MG suppository If constipation not relieved by MOM, give '10mg'$  rectally x 1 dose in 24 hours as needed for constipation    . busPIRone (BUSPAR) 7.5 MG tablet Take 1 tablet (7.5 mg total) by mouth 2 (two) times daily.    . Cholecalciferol (VITAMIN D3) 50000 units CAPS Take 1 capsule by mouth every 30 (thirty) days.    Marland Kitchen lamoTRIgine (LAMICTAL) 100 MG tablet Take 100 mg by mouth 2 (two) times daily.     . magnesium hydroxide (MILK OF MAGNESIA) 400 MG/5ML suspension In no BM in 3 days, give 30cc x 1 dose in 24 hours as needed for constipation    . Omega-3 Fatty Acids (FISH OIL) 1000 MG CPDR Take 1,000 mg by mouth 2 (two) times daily.      Musculoskeletal: Strength & Muscle Tone: within normal limits Gait & Station: normal Patient leans: N/A  Psychiatric Specialty Exam: Physical Exam  Nursing note and vitals reviewed. Constitutional: She appears well-developed and well-nourished.  HENT:  Head: Normocephalic and atraumatic.  Neck: Normal range of motion.  Respiratory: Effort normal.  Musculoskeletal: Normal range of motion.  Neurological: She is alert.  Oriented to self and place.   Skin: No rash  noted.    Review of Systems  Psychiatric/Behavioral: Negative for depression, hallucinations, substance abuse and suicidal ideas. The patient is not nervous/anxious and does not have insomnia.     Blood pressure 137/68, pulse 69, temperature 98.2 F (36.8 C), temperature source Oral, resp. rate 16, SpO2 95 %.There is no height or weight on file to calculate BMI.  General Appearance: Well Groomed, elderly, Caucasian female with thinning hair who is lying in bed. NAD.   Eye Contact:  Good  Speech:  Normal Rate  Volume:  Decreased  Mood:  Euthymic  Affect:  Appropriate and  Congruent  Thought Process:  Linear  Orientation:  Other:  Oriented to person and place.  Thought Content:  Logical but at end of interview became very distressed about turning the lights off in her room and reported that they were falling down on her while they were on.  Suicidal Thoughts:  No  Homicidal Thoughts:  No  Memory:  Immediate;   Poor Recent;   Poor Remote;   Poor  Judgement:  Impaired  Insight:  Lacking  Psychomotor Activity:  Increased as she was moving around frequently and also disrobed.   Concentration:  Concentration: Fair and Attention Span: Fair  Recall:  Poor  Fund of Knowledge:  Poor  Language:  Poor  Akathisia:  No  Handed:  Right  AIMS (if indicated):   N/A  Assets:  Housing  ADL's:  Intact  Cognition:  Impaired,  Severe  Sleep:   Okay   Assessment:  Holly Rivera is a 83 y/o female who was admitted with SI from her nursing home for SI. She denies SI today and does not remember endorsing SI. She is likely delirious secondary to an active UTI. She was started on treatment today and will be observed overnight and likely discharged back to her facility tomorrow.   Treatment Plan Summary: Daily contact with patient to assess and evaluate symptoms and progress in treatment and Medication management  -Continue home medications: Buspar 7.5 mg BID and Lamictal 100 mg BID.  -Continue Rocephin 1 g daily for 7 days for UTI.   Disposition: Observe overnight and likely discharge tomorrow.  Faythe Dingwall, DO 12/31/2016 12:11 PM

## 2017-01-01 MED ORDER — CEPHALEXIN 500 MG PO CAPS: 500.0000 mg | ORAL_CAPSULE | Freq: Two times a day (BID) | ORAL | Status: DC

## 2017-01-01 MED ORDER — LIDOCAINE HCL (PF) 1 % IJ SOLN
INTRAMUSCULAR | Status: AC
  Administered 2017-01-01: 10:00:00
  Filled 2017-01-01: qty 5

## 2017-01-01 MED ORDER — CEPHALEXIN 500 MG PO CAPS: 500.0000 mg | ORAL_CAPSULE | Freq: Two times a day (BID) | ORAL | 0 refills | Status: DC

## 2017-01-01 NOTE — Progress Notes (Signed)
CSW was notified per TTS that pt has been psychiatrically cleared and plan to DC from ED today.  Pt resident of [facility]. CSW spoke with facility- states pt is long term resident for several years and "this was not like her at all, generally stable mental status." Reports at DC from ED they will need ED provider report with DC medications listed. Once psychiatry note and provider note clearing pt for DC and listing D medications available- will send to facility and assist with transition back to [facility].  Ilean SkillMeghan Alford Gamero, MSW, LCSW Clinical Social Work 01/01/2017 Coverage for [phone]

## 2017-01-01 NOTE — [facility] Suicide Risk Assessment (Signed)
Suicide Risk Assessment  Discharge Assessment   [facility]   Principal Problem: Delirium due to another medical condition Discharge Diagnoses:  Patient Active Problem List   Diagnosis Date Noted  . Delirium due to another medical condition [F05] 12/31/2016  . Vitamin D deficiency [E55.9] 12/05/2015  . High risk medication use [Z79.899] 09/06/2015  . Severe episode of recurrent major depressive disorder, with psychotic features ([facility]) [F33.3] 05/01/2015  . Mood disorder ([facility]) [F39] 08/02/2014  . Hyperlipidemia LDL goal <130 [E78.5] 06/21/2013  . PVD (peripheral vascular disease) ([facility]) [I73.9] 04/14/2013  . CKD (chronic kidney disease) stage 2, GFR 60-89 ml/min [N18.2] 04/14/2013  . Dementia with behavioral disturbance [F03.91] 02/15/2013  . Depression [F32.9] 11/15/2012  . Osteoarthrosis, unspecified whether generalized or localized, involving lower leg [M17.10] 06/29/2012  . Memory loss [R41.3] 06/29/2012  . Unable to ambulate [R26.2] 04/09/2012  . Frequent falls [R29.6] 04/09/2012    Total Time spent with patient: 45 minutes  Musculoskeletal: Strength & Muscle Tone: within normal limits Gait & Station: normal Patient leans: N/A  Psychiatric Specialty Exam: Physical Exam  Constitutional: She appears well-developed and well-nourished.  HENT:  Head: Normocephalic.  Respiratory: Effort normal.  Musculoskeletal: Normal range of motion.  Neurological: She is alert.  Psychiatric: Her speech is normal. She is slowed. Thought content is delusional. Cognition and memory are not impaired. She expresses impulsivity. She exhibits a depressed mood. She exhibits abnormal recent memory and abnormal remote memory.   Review of Systems  Psychiatric/Behavioral: Positive for depression and hallucinations. Negative for memory loss, substance abuse and suicidal ideas. The patient is not nervous/anxious and does not have insomnia.   All other systems reviewed and are  negative.  Blood pressure (!) 154/68, pulse 84, temperature (!) 97.4 F (36.3 C), temperature source Oral, resp. rate 16, SpO2 96 %.There is no height or weight on file to calculate BMI. General Appearance: Casual Eye Contact:  Good Speech:  Slow Volume:  Normal Mood:  Depressed and dementia Affect:  Depressed and dementia Thought Process:  Disorganized Orientation:  Other:  person Thought Content:  dementia Suicidal Thoughts:  No Homicidal Thoughts:  No Memory:  Immediate;   Fair Recent;   Poor Remote;   Poor Judgement:  Other:  dementia Insight:  dementia Psychomotor Activity:  Decreased Concentration:  Concentration: Fair and Attention Span: Fair Recall:  Poor Fund of Knowledge:  Fair Language:  Good Akathisia:  No Handed:  Right AIMS (if indicated):    Assets:  Financial Resources/Insurance Housing Social Support ADL's:  Intact Cognition:  WNL   Mental Status Per Nursing Assessment::   On Admission:   deleriuus  Demographic Factors:  Age 83 or older and Caucasian  Loss Factors: Decline in physical health  Historical Factors: Impulsivity  Risk Reduction Factors:   Living with another person, especially a relative and Positive social support  Continued Clinical Symptoms:  Depression:   Impulsivity  Cognitive Features That Contribute To Risk:  Loss of executive function    Suicide Risk:  Minimal: No identifiable suicidal ideation.  Patients presenting with no risk factors but with morbid ruminations; may be classified as minimal risk based on the severity of the depressive symptoms  Contact information for after-discharge care    Destination    [facility] .   Service:  Skilled Nursing Contact information: [address] [place] [id number] [phone]              Plan Of Care/Follow-up  recommendations:  Activity:  as tolerated Diet:  Heart Healthy  Laveda AbbeLaurie Britton Parks, NP 01/01/2017, 1:54  PM

## 2017-01-01 NOTE — ED Notes (Signed)
[facility] called for transport back to nursing facility. 

## 2017-01-01 NOTE — Consult Note (Signed)
[place] [facility] Consult   Reason for Consult:  Altered mental status Referring Physician:  EDP Patient Identification: Holly Rivera MRN:  [id number] Principal Diagnosis: Delirium due to another medical condition Diagnosis:   Patient Active Problem List   Diagnosis Date Noted  . Delirium due to another medical condition [F05] 12/31/2016  . Vitamin D deficiency [E55.9] 12/05/2015  . High risk medication use [Z79.899] 09/06/2015  . Severe episode of recurrent major depressive disorder, with psychotic features ([place]) [F33.3] 05/01/2015  . Mood disorder ([place]) [F39] 08/02/2014  . Hyperlipidemia LDL goal <130 [E78.5] 06/21/2013  . PVD (peripheral vascular disease) ([place]) [I73.9] 04/14/2013  . CKD (chronic kidney disease) stage 2, GFR 60-89 ml/min [N18.2] 04/14/2013  . Dementia with behavioral disturbance [F03.91] 02/15/2013  . Depression [F32.9] 11/15/2012  . Osteoarthrosis, unspecified whether generalized or localized, involving lower leg [M17.10] 06/29/2012  . Memory loss [R41.3] 06/29/2012  . Unable to ambulate [R26.2] 04/09/2012  . Frequent falls [R29.6] 04/09/2012    Total Time spent with patient: 45 minutes  Subjective:   Holly Rivera is a 83 y.o. female patient admitted with delerium.  HPI:  Pt was seen and chart reviewed with treatment team and Dr Mariea Clonts. Pt resides at [facility] and may return there on discharge. Pt is a 83 year old female who was sent to the emergency room due to altered mental status. Pt was found to have a urinary tract infection upon admission and is undergoing treatment for this. Pt is currently at her baseline with her dementia. Pt is stable and psychiatrically clear for discharge.   Past Psychiatric History: As above  Risk to Self: None Risk to Others: None Prior Inpatient Therapy: Prior Inpatient Therapy: No Prior Outpatient Therapy: Prior Outpatient Therapy: No Does patient have an ACCT team?: No Does patient  have Intensive In-House Services?  : No Does patient have [facility] services? : No Does patient have P4CC services?: No  Past Medical History:  Past Medical History:  Diagnosis Date  . Arthritis   . Depression   . Falls   . Hyperlipidemia   . Major depressive disorder   . Pneumonia   . Thyroid disease    hypothryriodism    Past Surgical History:  Procedure Laterality Date  . APPENDECTOMY     Family History: No family history on file. Family Psychiatric  History:Unknown Social History:  Social History   Substance and Sexual Activity  Alcohol Use No     Social History   Substance and Sexual Activity  Drug Use Yes    Social History   Socioeconomic History  . Marital status: Widowed    Spouse name: None  . Number of children: None  . Years of education: None  . Highest education level: None  Social Needs  . Financial resource strain: None  . Food insecurity - worry: None  . Food insecurity - inability: None  . Transportation needs - medical: None  . Transportation needs - non-medical: None  Occupational History  . None  Tobacco Use  . Smoking status: Never Smoker  . Smokeless tobacco: Never Used  Substance and Sexual Activity  . Alcohol use: No  . Drug use: Yes  . Sexual activity: No  Other Topics Concern  . None  Social History Narrative  . None   Additional Social History: N/A    Allergies:   Allergies  Allergen Reactions  . Penicillins Anaphylaxis    Labs:  Results for orders placed or performed during the  hospital encounter of 12/30/16 (from the past 48 hour(s))  Comprehensive metabolic panel     Status: Abnormal   Collection Time: 12/30/16  5:11 PM  Result Value Ref Range   Sodium 143 135 - 145 mmol/L   Potassium 3.7 3.5 - 5.1 mmol/L   Chloride 105 101 - 111 mmol/L   CO2 29 22 - 32 mmol/L   Glucose, Bld 91 65 - 99 mg/dL   BUN 26 (H) 6 - 20 mg/dL   Creatinine, Ser 0.71 0.44 - 1.00 mg/dL   Calcium 9.1 8.9 - 10.3 mg/dL   Total Protein 5.9  (L) 6.5 - 8.1 g/dL   Albumin 3.4 (L) 3.5 - 5.0 g/dL   AST 20 15 - 41 U/L   ALT 17 14 - 54 U/L   Alkaline Phosphatase 55 38 - 126 U/L   Total Bilirubin 0.5 0.3 - 1.2 mg/dL   GFR calc non Af Amer >60 >60 mL/min   GFR calc Af Amer >60 >60 mL/min    Comment: (NOTE) The eGFR has been calculated using the CKD EPI equation. This calculation has not been validated in all clinical situations. eGFR's persistently <60 mL/min signify possible Chronic Kidney Disease.    Anion gap 9 5 - 15  Ethanol     Status: None   Collection Time: 12/30/16  5:11 PM  Result Value Ref Range   Alcohol, Ethyl (B) <10 <10 mg/dL    Comment:        LOWEST DETECTABLE LIMIT FOR SERUM ALCOHOL IS 10 mg/dL FOR MEDICAL PURPOSES ONLY   Salicylate level     Status: None   Collection Time: 12/30/16  5:11 PM  Result Value Ref Range   Salicylate Lvl <7.8 2.8 - 30.0 mg/dL  Acetaminophen level     Status: None   Collection Time: 12/30/16  5:11 PM  Result Value Ref Range   Acetaminophen (Tylenol), Serum 11 10 - 30 ug/mL    Comment:        THERAPEUTIC CONCENTRATIONS VARY SIGNIFICANTLY. A RANGE OF 10-30 ug/mL MAY BE AN EFFECTIVE CONCENTRATION FOR MANY PATIENTS. HOWEVER, SOME ARE BEST TREATED AT CONCENTRATIONS OUTSIDE THIS RANGE. ACETAMINOPHEN CONCENTRATIONS >150 ug/mL AT 4 HOURS AFTER INGESTION AND >50 ug/mL AT 12 HOURS AFTER INGESTION ARE OFTEN ASSOCIATED WITH TOXIC REACTIONS.   cbc     Status: None   Collection Time: 12/30/16  5:11 PM  Result Value Ref Range   WBC 6.8 4.0 - 10.5 K/uL   RBC 4.00 3.87 - 5.11 MIL/uL   Hemoglobin 12.5 12.0 - 15.0 g/dL   HCT 38.1 36.0 - 46.0 %   MCV 95.3 78.0 - 100.0 fL   MCH 31.3 26.0 - 34.0 pg   MCHC 32.8 30.0 - 36.0 g/dL   RDW 14.1 11.5 - 15.5 %   Platelets 258 150 - 400 K/uL  Rapid urine drug screen (hospital performed)     Status: None   Collection Time: 12/30/16  8:27 PM  Result Value Ref Range   Opiates NONE DETECTED NONE DETECTED   Cocaine NONE DETECTED NONE  DETECTED   Benzodiazepines NONE DETECTED NONE DETECTED   Amphetamines NONE DETECTED NONE DETECTED   Tetrahydrocannabinol NONE DETECTED NONE DETECTED   Barbiturates NONE DETECTED NONE DETECTED    Comment:        DRUG SCREEN FOR MEDICAL PURPOSES ONLY.  IF CONFIRMATION IS NEEDED FOR ANY PURPOSE, NOTIFY LAB WITHIN 5 DAYS.        LOWEST DETECTABLE LIMITS FOR URINE DRUG SCREEN  Drug Class       Cutoff (ng/mL) Amphetamine      1000 Barbiturate      200 Benzodiazepine   637 Tricyclics       858 Opiates          300 Cocaine          300 THC              50   Urinalysis, Routine w reflex microscopic     Status: Abnormal   Collection Time: 12/30/16  8:27 PM  Result Value Ref Range   Color, Urine YELLOW YELLOW   APPearance CLOUDY (A) CLEAR   Specific Gravity, Urine 1.030 1.005 - 1.030   pH 5.0 5.0 - 8.0   Glucose, UA NEGATIVE NEGATIVE mg/dL   Hgb urine dipstick NEGATIVE NEGATIVE   Bilirubin Urine NEGATIVE NEGATIVE   Ketones, ur 5 (A) NEGATIVE mg/dL   Protein, ur NEGATIVE NEGATIVE mg/dL   Nitrite POSITIVE (A) NEGATIVE   Leukocytes, UA LARGE (A) NEGATIVE   RBC / HPF 6-30 0 - 5 RBC/hpf   WBC, UA TOO NUMEROUS TO COUNT 0 - 5 WBC/hpf   Bacteria, UA MANY (A) NONE SEEN   Squamous Epithelial / LPF 6-30 (A) NONE SEEN   Ca Oxalate Crys, UA PRESENT   Urine culture     Status: Abnormal (Preliminary result)   Collection Time: 12/30/16  8:27 PM  Result Value Ref Range   Specimen Description URINE, CLEAN CATCH    Special Requests NONE    Culture >=100,000 COLONIES/mL ESCHERICHIA COLI (A)    Report Status PENDING     Current Facility-Administered Medications  Medication Dose Route Frequency Provider Last Rate Last Dose  . busPIRone (BUSPAR) tablet 7.5 mg  7.5 mg Oral BID McDonald, Mia A, PA-C   7.5 mg at 12/31/16 2225  . [START ON 01/02/2017] cephALEXin (KEFLEX) capsule 500 mg  500 mg Oral Q12H Ethelene Hal, NP      . lamoTRIgine (LAMICTAL) tablet 100 mg  100 mg Oral BID McDonald, Mia  A, PA-C   100 mg at 12/31/16 2225   Current Outpatient Medications  Medication Sig Dispense Refill  . acetaminophen (TYLENOL) 325 MG tablet Take 650 mg by mouth 2 (two) times daily.    . bisacodyl (DULCOLAX) 10 MG suppository Place 10 mg daily as needed rectally for moderate constipation. If constipation not relieved by MOM, give 20m rectally x 1 dose in 24 hours as needed for constipation     . busPIRone (BUSPAR) 7.5 MG tablet Take 1 tablet (7.5 mg total) by mouth 2 (two) times daily.    . Cholecalciferol (VITAMIN D3) 50000 units CAPS Take 1 capsule every 30 (thirty) days by mouth. On the 12th of the month.    . lamoTRIgine (LAMICTAL) 100 MG tablet Take 100 mg by mouth 2 (two) times daily.     . magnesium hydroxide (MILK OF MAGNESIA) 400 MG/5ML suspension Take 30 mLs daily as needed by mouth for mild constipation. In no BM in 3 days, give 30cc x 1 dose in 24 hours as needed for constipation     . Omega-3 Fatty Acids (FISH OIL) 1000 MG CPDR Take 1,000 mg by mouth 2 (two) times daily.    . Sodium Phosphates (RA SALINE ENEMA) 19-7 GM/118ML ENEM Place 118 mLs daily as needed rectally (severe constipation). Constipation not relieved by bisacodyl suppository give one enema daily as needed.      Musculoskeletal: Strength & Muscle Tone: within normal  limits Gait & Station: normal Patient leans: N/A  Psychiatric Specialty Exam: Physical Exam  Constitutional: She appears well-developed and well-nourished.  HENT:  Head: Normocephalic.  Respiratory: Effort normal.  Musculoskeletal: Normal range of motion.  Neurological: She is alert.  Psychiatric: Her speech is normal. She is slowed. Thought content is delusional. Cognition and memory are not impaired. She expresses impulsivity. She exhibits a depressed mood. She exhibits abnormal recent memory and abnormal remote memory.    Review of Systems  Psychiatric/Behavioral: Positive for depression and hallucinations. Negative for memory loss, substance  abuse and suicidal ideas. The patient is not nervous/anxious and does not have insomnia.   All other systems reviewed and are negative.   Blood pressure (!) 154/68, pulse 84, temperature (!) 97.4 F (36.3 C), temperature source Oral, resp. rate 16, SpO2 96 %.There is no height or weight on file to calculate BMI.  General Appearance: Casual  Eye Contact:  Good  Speech:  Slow  Volume:  Normal  Mood:  Depressed and dementia  Affect:  Depressed and dementia  Thought Process:  Disorganized  Orientation:  Other:  person  Thought Content:  Some delusional thought content but appears to be here baseline according to nursing facility.   Suicidal Thoughts:  No  Homicidal Thoughts:  No  Memory:  Immediate;   Fair Recent;   Poor Remote;   Poor  Judgement:  Other:  dementia  Insight:  dementia  Psychomotor Activity:  Decreased  Concentration:  Concentration: Fair and Attention Span: Fair  Recall:  Poor  Fund of Knowledge:  Fair  Language:  Good  Akathisia:  No  Handed:  Right  AIMS (if indicated):   N/A  Assets:  Financial Resources/Insurance Housing Social Support  ADL's:  Intact  Cognition:  WNL  Sleep:   Okay     Treatment Plan Summary: Plan  Delirium due to other medical condition  Discharge Home Follow up with outpatient PCP for medical issues Follow up with outpatient psychiatry at your nursing facility Take all medications as prescribed  Disposition: No evidence of imminent risk to self or others at present.   Patient does not meet criteria for psychiatric inpatient admission. Supportive therapy provided about ongoing stressors. Discussed crisis plan, support from social network, calling 911, coming to the Emergency Department, and calling Suicide Hotline.  Ethelene Hal, NP 01/01/2017 1:22 PM   Patient seen face-to-face for psychiatric evaluation, chart reviewed and case discussed with the physician extender and developed treatment plan. Reviewed the information  documented and agree with the treatment plan.  Buford Dresser, DO

## 2017-01-01 NOTE — ED Notes (Signed)
Patient slept on and off during the night. Awake this morning stating "someone is going to kill me if I don't". Reassured she is safe. She is calm and cooperative.

## 2017-01-02 LAB — URINE CULTURE: Culture: >=100000 — AB

## 2017-01-03 ENCOUNTER — Non-Acute Institutional Stay (SKILLED_NURSING_FACILITY): Payer: Medicare Other | Admitting: Internal Medicine

## 2017-01-03 ENCOUNTER — Telehealth: Payer: Self-pay

## 2017-01-03 ENCOUNTER — Encounter: Payer: Self-pay | Admitting: Internal Medicine

## 2017-01-03 DIAGNOSIS — F0391 Unspecified dementia with behavioral disturbance: Secondary | ICD-10-CM | POA: Diagnosis not present

## 2017-01-03 DIAGNOSIS — F333 Major depressive disorder, recurrent, severe with psychotic symptoms: Secondary | ICD-10-CM | POA: Diagnosis not present

## 2017-01-03 NOTE — Assessment & Plan Note (Addendum)
01/03/17 patient is hallucinating that the lights are falling from the ceiling. She fears being locked in her room and "allowed to die". She is repeatedly pulling the fire alarm

## 2017-01-03 NOTE — Progress Notes (Signed)
   NURSING HOME LOCATION:  [facility] ROOM NUMBER:  103-A  CODE STATUS:  Full Code  PCP:  Pecola LawlessHopper, Yusra Ravert F, MD  [address] [place] [id number]  This is a nursing facility follow up for Nursing Facility readmission within 30 days  Interim medical record and care [facility] was updated with review of diagnostic studies and change in clinical status since last visit were documented.  HPI:The patient was in the emergency room for mental health evaluation 11/5-11/7/18 for major depression with psychotic features in the context of dementia. The patient had been agitated over the prior week feeling someone was going to "lock me up & let me die". She was hallucinating that the bed was throwing her on the floor and the lights were falling from ceilings. She also expressed some suicidal ideation. The patient has activated the SNF fire alarm on at least 5 occasions in the last week. Current psychotropic medications are Lamictal and BuSpar. In the emergency room the patient denied any suicidal ideation , homicidal thoughts, auditory or visual hallucinations. Her major complaint was rash in the groin area. There were no associated gynecologic or GU symptoms. 11/5 labs revealed a BUN 26, total protein 5.9. CT without contrast revealed stable atrophy with supratentorial small vessel disease. Right frontal scalp hematoma without underlying fracture was noted. Radiographic changes of ethmoidal paranasal sinus disease were present. Upon return to the SNF she has pulled the fire alarm yet another time. Also she fell, this was not visualized.  Review of systems: Today she states "they're going to lock me in & let me die". She insists on being in the hallway to meet "my friend".  Physical exam:  Pertinent or positive findings: Initially she was sitting in the wheelchair scanning the ceiling from side to side. She resisted being  returned to her room for exam & when I took her  there she focused on being "locked up". She resisted exam intermittently pushing my arms away. There are 2 areas of bossing over the forehead, left greater than the right. Abrasion at the right upper forehead. There is faint bruising over the forehead.  She has profound central balding over the crown. Her tongue is markedly dry. Heart appears irregular. It's difficult to assess as she continued to talk throughout the exam. Breath sounds are decreased. Abdomen is protuberant and nontender. Pedal pulses are decreased. She is diffusely weak to opposition.  General appearance:Adequately nourished; no acute distress , increased work of breathing is present.   Lymphatic: No lymphadenopathy about the head, neck, axilla . Eyes: No conjunctival inflammation or lid edema is present. There is no scleral icterus. Ears:  External ear exam shows no significant lesions or deformities.   Nose:  External nasal examination shows no deformity or inflammation. Nasal mucosa are pink and moist without lesions ,exudates Oral exam: lips and gums are healthy appearing. Neck:  No thyromegaly, masses, tenderness noted.    Heart:  No definite gallop, murmur, click, rub .  Lungs: without wheezes, rhonchi,rales , rubs. Abdomen:Bowel sounds are normal. Abdomen is soft and nontender with no organomegaly, hernias,masses. GU: deferred  Extremities:  No cyanosis, clubbing,edema  Neurologic exam : Cn 2-7 intact Balance,Rhomberg,finger to nose testing could not be completed due to clinical state Skin: Warm & dry w/o tenting. No significant rash.  See summary under each active problem in the Problem List with associated updated therapeutic plan

## 2017-01-03 NOTE — Telephone Encounter (Signed)
Possible re-admission to facility. [facility] is needed if patient was re-admitted to facility upon discharge. [facility]

## 2017-01-03 NOTE — Telephone Encounter (Signed)
[facility] results from ED [facility]01/2017 [facility] [phone]

## 2017-01-03 NOTE — Patient Instructions (Signed)
See assessment and plan under each diagnosis in the problem list and acutely for this visit 

## 2017-01-03 NOTE — Assessment & Plan Note (Addendum)
Up to Date consulted: Resperidone 0.5 mg bid; SNF Pysch F/U ASAP Psychiatry can determine whether the Buspar is contributing to the mania

## 2017-01-07 ENCOUNTER — Encounter: Payer: Self-pay | Admitting: Internal Medicine

## 2017-01-09 ENCOUNTER — Non-Acute Institutional Stay (SKILLED_NURSING_FACILITY): Payer: Medicare Other | Admitting: Internal Medicine

## 2017-01-09 ENCOUNTER — Encounter: Payer: Self-pay | Admitting: Internal Medicine

## 2017-01-09 DIAGNOSIS — N390 Urinary tract infection, site not specified: Secondary | ICD-10-CM

## 2017-01-09 DIAGNOSIS — B9629 Other Escherichia coli [E. coli] as the cause of diseases classified elsewhere: Secondary | ICD-10-CM

## 2017-01-09 DIAGNOSIS — Z1612 Extended spectrum beta lactamase (ESBL) resistance: Secondary | ICD-10-CM

## 2017-01-09 DIAGNOSIS — F333 Major depressive disorder, recurrent, severe with psychotic symptoms: Secondary | ICD-10-CM | POA: Diagnosis not present

## 2017-01-09 NOTE — Patient Instructions (Signed)
See assessment and plan under each diagnosis in the problem list and acutely for this visit 

## 2017-01-09 NOTE — Progress Notes (Signed)
    NURSING HOME LOCATION:  [facility] ROOM NUMBER:  [id number]  CODE STATUS:  DNR  PCP:  Pecola LawlessHopper, William F, MD  [address] [place] [address]  This is a nursing facility follow up for specific acute issue of  acute mental status changes in the context of urosepsis.  Interim medical record and care [facility] was updated with review of diagnostic studies and change in clinical status since last visit were documented.  HPI: She was found to have > 100.,000 colonies of ESBL Escherichia coli on urine culture. Parenteral gentamicin as dosed by pharmacy was ordered as the most efficacious, cost effective therapy. Despite initiating the therapy patient continues to exhibit behavioral disorders. She's been throwing herself to the floor. Subsequently her mattresses was placed on the floor to prevent injury. She continues to exhibit paranoia that she will be locked up and allowed to die.  Review of systems: Dementia invalidated responses. Date given as [id number]. She had no clue who I was and why I was there despite this being one of several recent encounters.  Physical exam:  Pertinent or positive findings:   She has tearing at he lateral aspect f the right eye with slight yellow discoloration. There is no definite purulence or conjunctivitis Clinically.She is oriented only to self. She perserverates about her spoon on the floor. She wanted it picked up and washed to be reused.  She has pattern  alopecia mainly over the crown.She has resolving bruises over the forehead as well as bruising of the dorsum of the hands. Prominent stare is present. The maxilla is edentulous. The lower teeth are in poor repair. Heart  rhythm is irregular. Breath sounds are decreased. The.pedal pulses are  decreased. Feet are cool without ischemic changes. She is suprisingly strong to opposition in all extremities. General appearance:Adequately nourished; no acute distress , increased work of  breathing is present.   Lymphatic: No lymphadenopathy about the head, neck, axilla . Eyes: No conjunctival inflammation or lid edema is present. There is no scleral icterus. Ears:  External ear exam shows no significant lesions or deformities.   Nose:  External nasal examination shows no deformity or inflammation. Nasal mucosa are pink and moist without lesions ,exudates Oral exam: lips and gums are healthy appearing.There is no oropharyngeal erythema or exudate . Neck:  No thyromegaly, masses, tenderness noted.    Heart:  No gallop, murmur, click, rub .  Lungs: without wheezes, rhonchi,rales , rubs. Abdomen:Bowel sounds are normal. Abdomen is soft and nontender with no organomegaly, hernias,masses. GU: deferred  Extremities:  No cyanosis, clubbing,edema  Skin: Warm & dry w/o tenting. No significant lesions or rash.  See summary under each active problem in the Problem List with associated updated therapeutic plan

## 2017-01-09 NOTE — Assessment & Plan Note (Addendum)
Behavioral  issues continue despite Risperdal 0.5 mg twice a day. These are manifested as throwing herself on the floor. Increase Risperdal to 1 mg twice a day as trial. Psychiatry follow-up requested @ SNF ASAP  Inpatient treatment will be needed if her behavior persists or progresses as she potentially poses a threat to herself.

## 2017-03-10 ENCOUNTER — Non-Acute Institutional Stay (SKILLED_NURSING_FACILITY): Payer: Medicare Other | Admitting: Internal Medicine

## 2017-03-10 ENCOUNTER — Encounter: Payer: Self-pay | Admitting: Internal Medicine

## 2017-03-10 DIAGNOSIS — R401 Stupor: Secondary | ICD-10-CM

## 2017-03-10 DIAGNOSIS — R0689 Other abnormalities of breathing: Secondary | ICD-10-CM

## 2017-03-28 NOTE — Assessment & Plan Note (Signed)
Continue pulmonary toilet with supplemental oxygen, and nebulized treatments

## 2017-03-28 NOTE — Progress Notes (Signed)
    NURSING HOME LOCATION:  [facility] ROOM NUMBER:  103-A  CODE STATUS:  DNR  PCP:  Pecola LawlessHopper, Delynda Sepulveda F, MD  [address] [place] [id number]   This is a nursing facility follow up for specific acute issue of  Change in respirations. Interim medical record and care [facility] was updated with review of diagnostic studies and change in clinical status since last visit were documented.  HPI: [facility] on call team was notified 1/13  that the patient was "wheezing". X-ray and neb treatments were ordered. No acute changes were noted on the x-ray. This was personally reviewed by me and the [employer] NP. The chest x-ray shows extremely poor inspiration to 4 & a half interspaces & rotation which impact interpretation. Increased reticular interstitial changes are suggested with possible platelike atelectasis in the right lower lobe. Bilateral costophrenic angle blunting was present. There was no evidence of any pneumonic process or definite congestive heart failure. CBC revealed slight increase in neutrophil count. Total white count was within normal limits. Review of systems: The patient is basically unresponsive to even noxious stimulus. She can provide no history.  Physical exam:  Pertinent or positive findings: The patient is on oxygen & receiving a nebulized bronchodilator treatment. As noted she was unresponsive to even noxious stimulus. She did not open her eyes during the exam. She has coarse ,wet juicy rhonchi and rales in all lung fields. There is no neck vein distention. Heart sounds are obscured. Bowel sounds are decreased. Upper extremities are limp. There is increase in lower extremity resistance without any spontaneous movements. She has marked deformities of the toes with contractions. The right great toe is deviated laterally under the second toe. She has involuntary jerking of the feet when stimulated.  General appearance:Adequately nourished Lymphatic: No  lymphadenopathy about the head, neck, axilla . Eyes: No conjunctival inflammation or lid edema is present. There is no scleral icterus. Ears:  External ear exam shows no significant lesions or deformities.   Nose:  External nasal examination shows no deformity or inflammation. Nasal mucosa are pink and moist without lesions ,exudates Oral exam: lips and gums are healthy appearing.There is no oropharyngeal erythema or exudate . Neck:  No thyromegaly, masses, tenderness noted.    Heart:  No murmur, click, rub .  Abdomen:Bowel sounds are normal. Abdomen is soft and nontender with no organomegaly, hernias,masses. GU: deferred  Extremities:  No cyanosis, clubbing,edema  Neurologic exam : Balance,Rhomberg,finger to nose testing could not be completed due to clinical state Skin: Warm & dry w/o tenting. No significant lesions or rash.  See summary under each active problem in the Problem List with associated updated therapeutic plan

## 2017-03-28 NOTE — Patient Instructions (Signed)
See assessment and plan under each diagnosis in the problem list and acutely for this visit Palliative, supportive care

## 2017-03-28 DEATH — deceased

## 2018-02-22 IMAGING — DX DG WRIST COMPLETE 3+V*R*
4 series · 4 of 4 positions shown · non-contrast
Comparison: None.

CLINICAL DATA: Pain following fall

EXAM:
RIGHT WRIST - COMPLETE 3+ VIEW

[wrist pa]
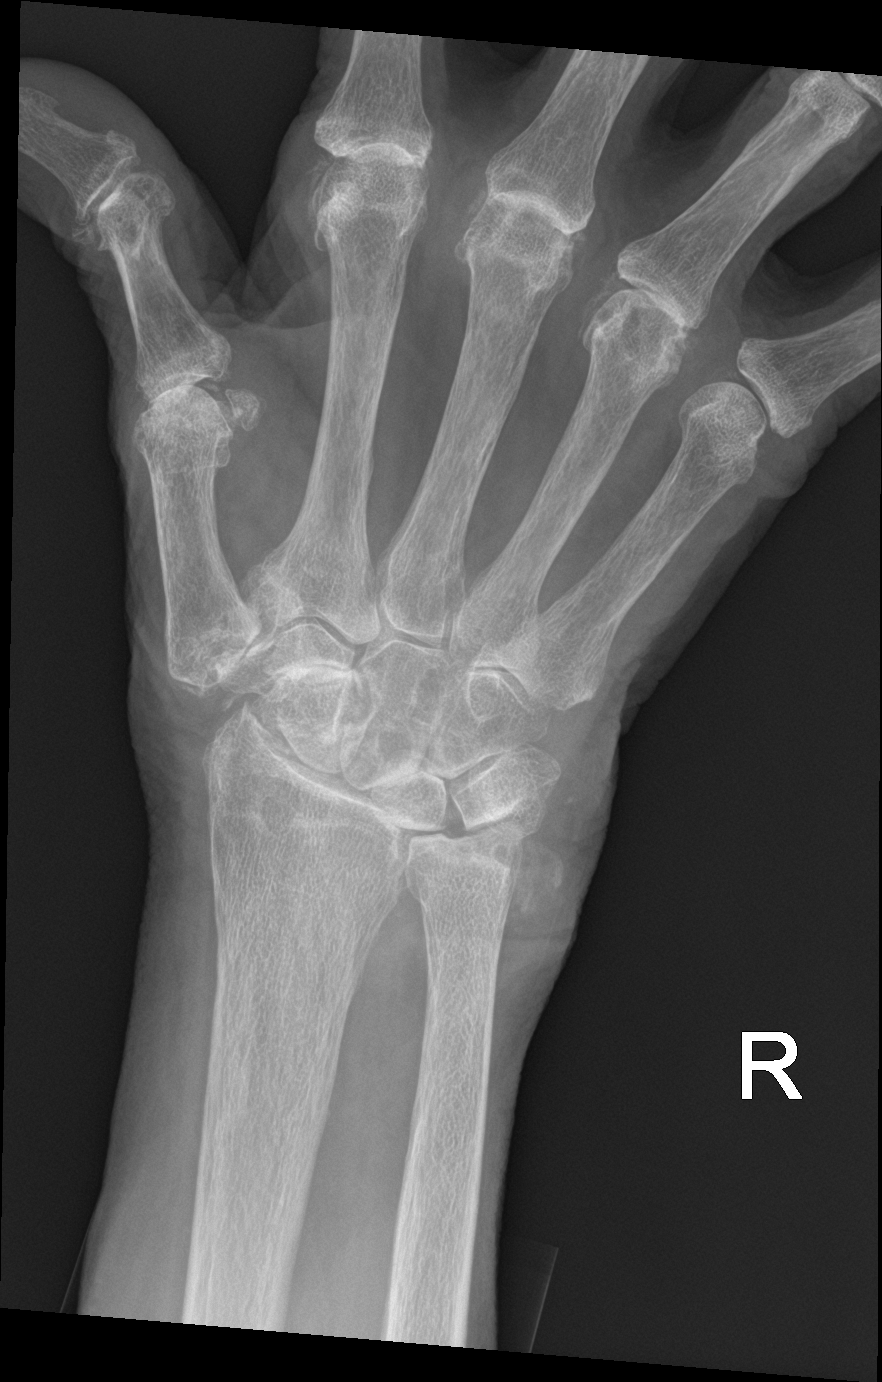

[wrist obl]
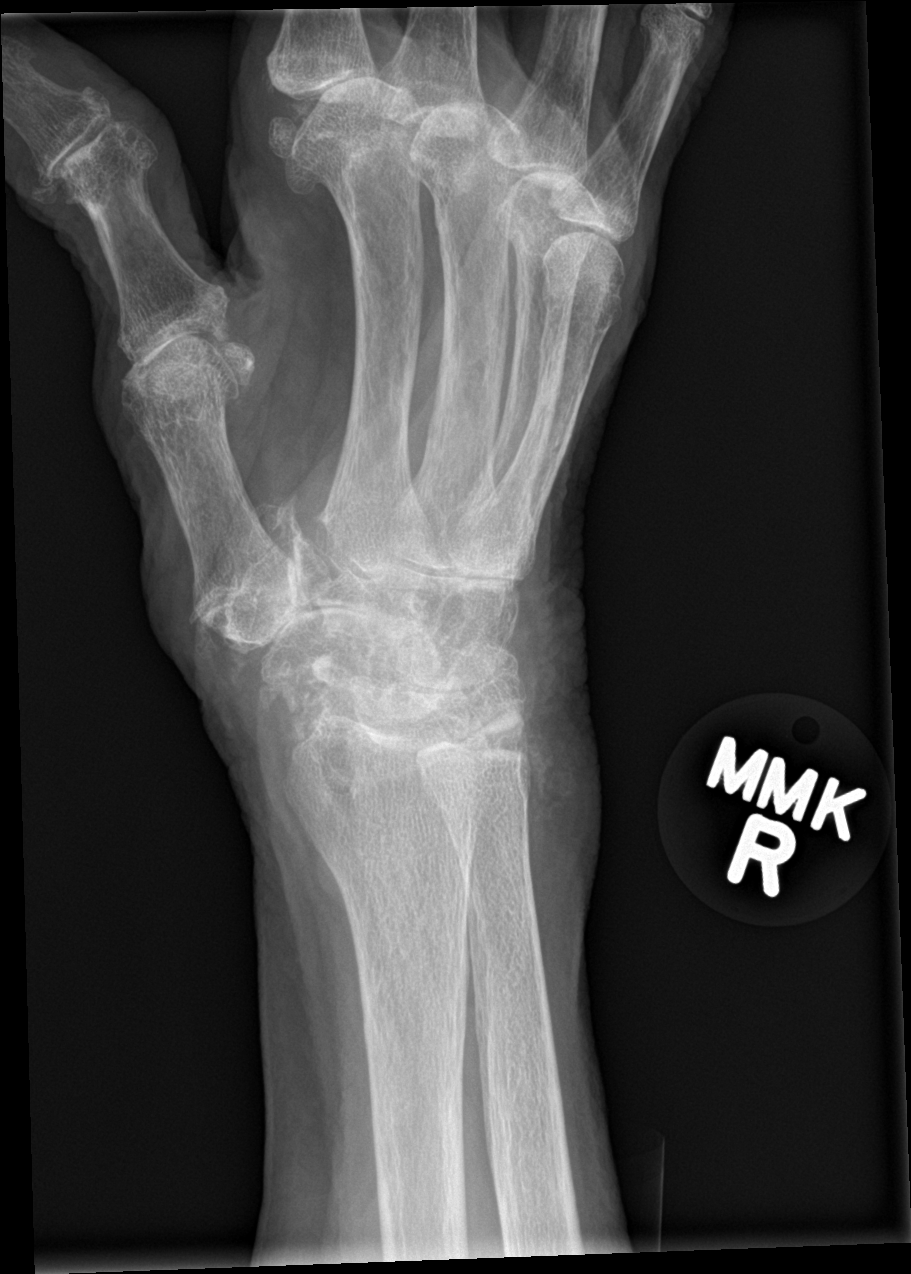

[wrist lat]
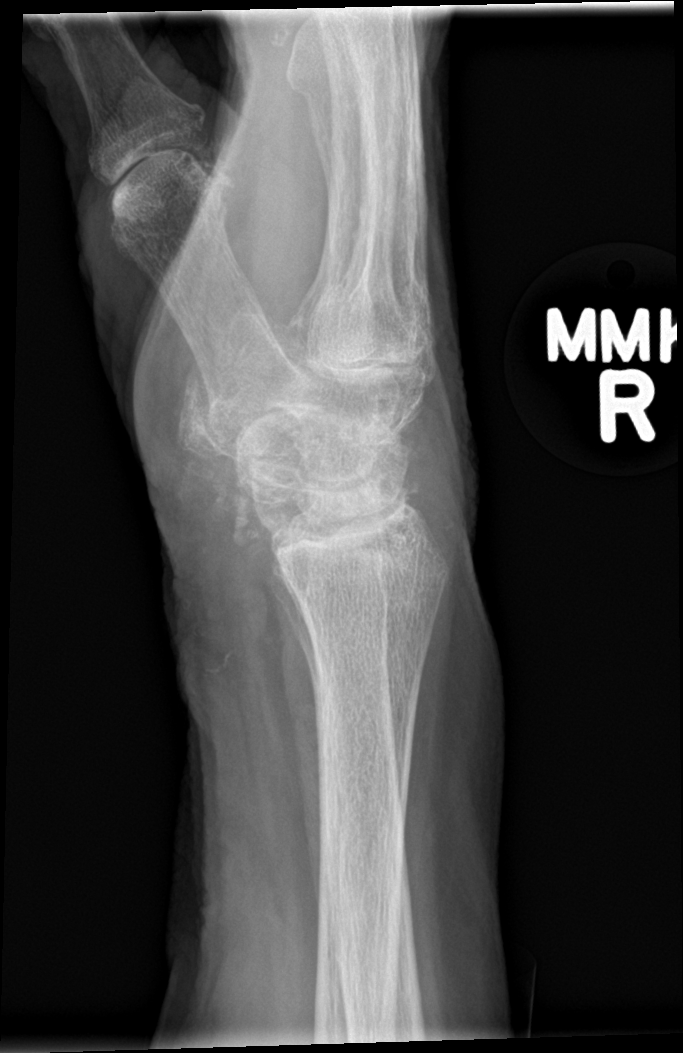

[wrist navicular]
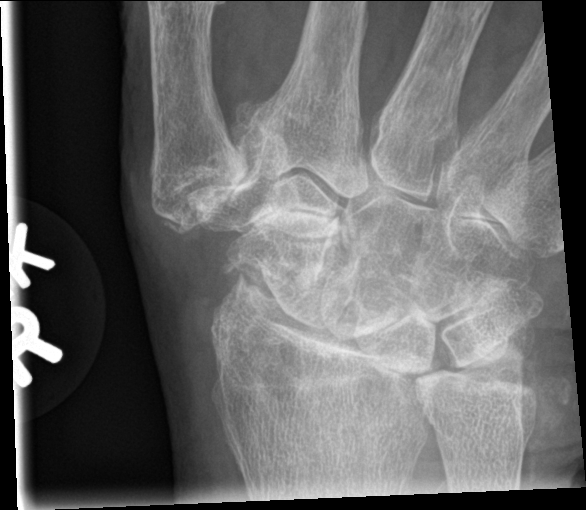

[4 of 4 positions shown; findings below may reference images not displayed]

FINDINGS: Frontal, oblique, lateral, and ulnar deviation scaphoid images were
obtained. There is no evident acute fracture or dislocation. There
is marked narrowing of the radiocarpal and ulnocarpal joints as well
as narrowing in the first carpal -metacarpal joint. There is also
moderately severe narrowing of the scaphotrapezial joint. Multiple
intra-articular calcifications are noted. No erosive change. Bones
are osteoporotic. There is extensive osteoarthritic change in the
first, second, third, and fourth MCP joints. Bones are osteoporotic.
Pronator quadratus fat pad is not elevated.
IMPRESSION: Extensive multilevel osteoarthritic change. Intra-articular
calcification is likely of arthropathic etiology, although prior
trauma could account for some or all of this calcification. Bones
are diffusely osteoporotic. No acute fracture or dislocation
evident.
